# Patient Record
Sex: Female | Born: 1980 | Race: White | Hispanic: Yes | Marital: Single | Smoking: Never smoker
Health system: Southern US, Community
[De-identification: ages and names within clinical notes are randomized; demographics above are authoritative.]

## PROBLEM LIST (undated history)

## (undated) ENCOUNTER — Inpatient Hospital Stay (HOSPITAL_COMMUNITY): Payer: Self-pay

## (undated) DIAGNOSIS — L309 Dermatitis, unspecified: Secondary | ICD-10-CM

## (undated) DIAGNOSIS — B999 Unspecified infectious disease: Secondary | ICD-10-CM

## (undated) HISTORY — DX: Unspecified infectious disease: B99.9

## (undated) HISTORY — PX: TUBAL LIGATION: SHX77

---

## 2001-06-13 ENCOUNTER — Emergency Department (HOSPITAL_COMMUNITY): Admission: EM | Admit: 2001-06-13 | Discharge: 2001-06-13 | Payer: Self-pay | Admitting: Emergency Medicine

## 2004-11-13 HISTORY — PX: INCISION AND DRAINAGE POSTERIOR SACRAL SPINE: SHX1806

## 2005-05-05 ENCOUNTER — Ambulatory Visit: Payer: Self-pay | Admitting: *Deleted

## 2005-05-05 ENCOUNTER — Inpatient Hospital Stay (HOSPITAL_COMMUNITY): Admission: AD | Admit: 2005-05-05 | Discharge: 2005-05-05 | Payer: Self-pay | Admitting: *Deleted

## 2005-05-15 ENCOUNTER — Ambulatory Visit: Payer: Self-pay | Admitting: Family Medicine

## 2005-05-15 ENCOUNTER — Inpatient Hospital Stay (HOSPITAL_COMMUNITY): Admission: AD | Admit: 2005-05-15 | Discharge: 2005-05-15 | Payer: Self-pay | Admitting: Obstetrics & Gynecology

## 2005-10-05 ENCOUNTER — Inpatient Hospital Stay (HOSPITAL_COMMUNITY): Admission: AD | Admit: 2005-10-05 | Discharge: 2005-10-07 | Payer: Self-pay | Admitting: Obstetrics

## 2007-06-11 ENCOUNTER — Emergency Department (HOSPITAL_COMMUNITY): Admission: EM | Admit: 2007-06-11 | Discharge: 2007-06-11 | Payer: Self-pay | Admitting: Emergency Medicine

## 2009-07-15 ENCOUNTER — Inpatient Hospital Stay (HOSPITAL_COMMUNITY): Admission: EM | Admit: 2009-07-15 | Discharge: 2009-07-20 | Payer: Self-pay | Admitting: Emergency Medicine

## 2009-08-13 ENCOUNTER — Ambulatory Visit: Payer: Self-pay | Admitting: Internal Medicine

## 2009-09-24 ENCOUNTER — Ambulatory Visit: Payer: Self-pay | Admitting: Family Medicine

## 2009-09-24 LAB — CONVERTED CEMR LAB
BUN: 13 mg/dL (ref 6–23)
Calcium: 10.2 mg/dL (ref 8.4–10.5)
Creatinine, Ser: 0.55 mg/dL (ref 0.40–1.20)
Free T4: 1.17 ng/dL (ref 0.80–1.80)
Glucose, Bld: 85 mg/dL (ref 70–99)
HCT: 40.4 % (ref 36.0–46.0)
Hemoglobin: 13.6 g/dL (ref 12.0–15.0)
Lymphocytes Relative: 33 % (ref 12–46)
Monocytes Relative: 11 % (ref 3–12)
Neutrophils Relative %: 54 % (ref 43–77)
Potassium: 4.2 meq/L (ref 3.5–5.3)
RDW: 13.1 % (ref 11.5–15.5)
Sodium: 138 meq/L (ref 135–145)
TSH: 0.489 microintl units/mL (ref 0.350–4.500)
Vit D, 25-Hydroxy: 26 ng/mL — ABNORMAL LOW (ref 30–89)

## 2010-01-14 ENCOUNTER — Ambulatory Visit: Payer: Self-pay | Admitting: Family Medicine

## 2010-02-24 ENCOUNTER — Ambulatory Visit: Payer: Self-pay | Admitting: Family Medicine

## 2010-07-05 ENCOUNTER — Emergency Department (HOSPITAL_COMMUNITY): Admission: EM | Admit: 2010-07-05 | Discharge: 2010-07-05 | Payer: Self-pay | Admitting: Emergency Medicine

## 2011-01-27 LAB — POCT I-STAT, CHEM 8
Chloride: 107 mEq/L (ref 96–112)
Creatinine, Ser: 0.7 mg/dL (ref 0.4–1.2)
Glucose, Bld: 107 mg/dL — ABNORMAL HIGH (ref 70–99)
HCT: 36 % (ref 36.0–46.0)
Hemoglobin: 12.2 g/dL (ref 12.0–15.0)
Potassium: 3.5 mEq/L (ref 3.5–5.1)
TCO2: 22 mmol/L (ref 0–100)

## 2011-01-27 LAB — URINALYSIS, ROUTINE W REFLEX MICROSCOPIC
Bilirubin Urine: NEGATIVE
Glucose, UA: NEGATIVE mg/dL
pH: 5.5 (ref 5.0–8.0)

## 2011-01-27 LAB — URINE CULTURE
Colony Count: >=100000
Culture  Setup Time: 201108231554

## 2011-01-27 LAB — DIFFERENTIAL
Basophils Absolute: 0 10*3/uL (ref 0.0–0.1)
Basophils Relative: 0 % (ref 0–1)
Eosinophils Relative: 0 % (ref 0–5)
Lymphocytes Relative: 6 % — ABNORMAL LOW (ref 12–46)
Neutro Abs: 15.3 10*3/uL — ABNORMAL HIGH (ref 1.7–7.7)
Neutrophils Relative %: 88 % — ABNORMAL HIGH (ref 43–77)

## 2011-01-27 LAB — CBC
HCT: 34.6 % — ABNORMAL LOW (ref 36.0–46.0)
MCHC: 35 g/dL (ref 30.0–36.0)

## 2011-01-27 LAB — URINE MICROSCOPIC-ADD ON

## 2011-02-17 LAB — CULTURE, BLOOD (ROUTINE X 2): Culture: NO GROWTH

## 2011-02-17 LAB — BASIC METABOLIC PANEL
BUN: 3 mg/dL — ABNORMAL LOW (ref 6–23)
BUN: 6 mg/dL (ref 6–23)
BUN: 7 mg/dL (ref 6–23)
Calcium: 7.6 mg/dL — ABNORMAL LOW (ref 8.4–10.5)
Calcium: 8.1 mg/dL — ABNORMAL LOW (ref 8.4–10.5)
Chloride: 108 mEq/L (ref 96–112)
Chloride: 114 mEq/L — ABNORMAL HIGH (ref 96–112)
Creatinine, Ser: 0.47 mg/dL (ref 0.4–1.2)
Creatinine, Ser: 0.51 mg/dL (ref 0.4–1.2)
GFR calc non Af Amer: >60 mL/min (ref >60)
GFR calc non Af Amer: >60 mL/min (ref >60)
Glucose, Bld: 108 mg/dL — ABNORMAL HIGH (ref 70–99)
Glucose, Bld: 170 mg/dL — ABNORMAL HIGH (ref 70–99)
Potassium: 3.5 mEq/L (ref 3.5–5.1)
Potassium: 3.7 mEq/L (ref 3.5–5.1)
Sodium: 141 mEq/L (ref 135–145)

## 2011-02-17 LAB — DIFFERENTIAL
Basophils Absolute: 0 10*3/uL (ref 0.0–0.1)
Basophils Absolute: 0 10*3/uL (ref 0.0–0.1)
Basophils Absolute: 0 10*3/uL (ref 0.0–0.1)
Basophils Absolute: 0 10*3/uL (ref 0.0–0.1)
Basophils Absolute: 0 10*3/uL (ref 0.0–0.1)
Basophils Relative: 0 % (ref 0–1)
Eosinophils Absolute: 0 10*3/uL (ref 0.0–0.7)
Eosinophils Absolute: 0 10*3/uL (ref 0.0–0.7)
Eosinophils Relative: 0 % (ref 0–5)
Eosinophils Relative: 0 % (ref 0–5)
Eosinophils Relative: 0 % (ref 0–5)
Lymphocytes Relative: 16 % (ref 12–46)
Lymphocytes Relative: 28 % (ref 12–46)
Lymphocytes Relative: 9 % — ABNORMAL LOW (ref 12–46)
Lymphs Abs: 0.9 10*3/uL (ref 0.7–4.0)
Lymphs Abs: 1.5 10*3/uL (ref 0.7–4.0)
Lymphs Abs: 2.6 10*3/uL (ref 0.7–4.0)
Monocytes Absolute: 0.6 10*3/uL (ref 0.1–1.0)
Monocytes Absolute: 0.8 10*3/uL (ref 0.1–1.0)
Neutro Abs: 5.8 10*3/uL (ref 1.7–7.7)
Neutro Abs: 6 10*3/uL (ref 1.7–7.7)
Neutro Abs: 7.5 10*3/uL (ref 1.7–7.7)
Neutrophils Relative %: 64 % (ref 43–77)
Neutrophils Relative %: 82 % — ABNORMAL HIGH (ref 43–77)
Neutrophils Relative %: 89 % — ABNORMAL HIGH (ref 43–77)

## 2011-02-17 LAB — MAGNESIUM
Magnesium: 1.6 mg/dL (ref 1.5–2.5)
Magnesium: 2.1 mg/dL (ref 1.5–2.5)
Magnesium: 2.4 mg/dL (ref 1.5–2.5)

## 2011-02-17 LAB — BRAIN NATRIURETIC PEPTIDE
Pro B Natriuretic peptide (BNP): 160 pg/mL — ABNORMAL HIGH (ref 0.0–100.0)
Pro B Natriuretic peptide (BNP): 214 pg/mL — ABNORMAL HIGH (ref 0.0–100.0)

## 2011-02-17 LAB — CBC
HCT: 31.2 % — ABNORMAL LOW (ref 36.0–46.0)
HCT: 31.5 % — ABNORMAL LOW (ref 36.0–46.0)
HCT: 38.4 % (ref 36.0–46.0)
Hemoglobin: 10.6 g/dL — ABNORMAL LOW (ref 12.0–15.0)
Hemoglobin: 10.8 g/dL — ABNORMAL LOW (ref 12.0–15.0)
Hemoglobin: 13.1 g/dL (ref 12.0–15.0)
MCHC: 33.8 g/dL (ref 30.0–36.0)
MCHC: 34 g/dL (ref 30.0–36.0)
MCV: 91.6 fL (ref 78.0–100.0)
MCV: 91.8 fL (ref 78.0–100.0)
MCV: 92.4 fL (ref 78.0–100.0)
MCV: 92.7 fL (ref 78.0–100.0)
Platelets: 182 10*3/uL (ref 150–400)
Platelets: 184 10*3/uL (ref 150–400)
Platelets: 214 10*3/uL (ref 150–400)
Platelets: 259 10*3/uL (ref 150–400)
RBC: 3.44 MIL/uL — ABNORMAL LOW (ref 3.87–5.11)
RBC: 4.16 MIL/uL (ref 3.87–5.11)
RDW: 13.4 % (ref 11.5–15.5)
RDW: 13.5 % (ref 11.5–15.5)
RDW: 13.6 % (ref 11.5–15.5)
RDW: 13.8 % (ref 11.5–15.5)
WBC: 11.8 10*3/uL — ABNORMAL HIGH (ref 4.0–10.5)
WBC: 13.5 10*3/uL — ABNORMAL HIGH (ref 4.0–10.5)
WBC: 7.8 10*3/uL (ref 4.0–10.5)
WBC: 9.4 10*3/uL (ref 4.0–10.5)

## 2011-02-17 LAB — COMPREHENSIVE METABOLIC PANEL
AST: 21 U/L (ref 0–37)
AST: 23 U/L (ref 0–37)
BUN: 2 mg/dL — ABNORMAL LOW (ref 6–23)
BUN: 8 mg/dL (ref 6–23)
CO2: 21 mEq/L (ref 19–32)
Calcium: 9 mg/dL (ref 8.4–10.5)
Chloride: 109 mEq/L (ref 96–112)
Creatinine, Ser: 0.56 mg/dL (ref 0.4–1.2)
GFR calc Af Amer: >60 mL/min (ref >60)
GFR calc non Af Amer: >60 mL/min (ref >60)
Glucose, Bld: 193 mg/dL — ABNORMAL HIGH (ref 70–99)
Potassium: 3.5 mEq/L (ref 3.5–5.1)
Sodium: 138 mEq/L (ref 135–145)
Total Bilirubin: 0.4 mg/dL (ref 0.3–1.2)
Total Bilirubin: 1.1 mg/dL (ref 0.3–1.2)

## 2011-02-17 LAB — URINE MICROSCOPIC-ADD ON

## 2011-02-17 LAB — URINALYSIS, ROUTINE W REFLEX MICROSCOPIC
Glucose, UA: NEGATIVE mg/dL
Nitrite: POSITIVE — AB
Protein, ur: 100 mg/dL — AB
Urobilinogen, UA: 0.2 mg/dL (ref 0.0–1.0)

## 2011-02-17 LAB — CARDIAC PANEL(CRET KIN+CKTOT+MB+TROPI)
CK, MB: 1.2 ng/mL (ref 0.3–4.0)
Total CK: 73 U/L (ref 7–177)
Troponin I: 0.01 ng/mL (ref 0.00–0.06)

## 2011-02-17 LAB — CALCIUM: Calcium: 7 mg/dL — ABNORMAL LOW (ref 8.4–10.5)

## 2011-02-17 LAB — T4, FREE: Free T4: 0.93 ng/dL (ref 0.80–1.80)

## 2011-02-17 LAB — URINE CULTURE

## 2011-03-28 NOTE — H&P (Signed)
NAMEGRACYNN, RAJEWSKI               ACCOUNT NO.:  [id number]   MEDICAL RECORD NO.:  [id number]          PATIENT TYPE:  OBV   LOCATION:  1864                         FACILITY:  [facility]   PHYSICIAN:  Lonia Blood, M.D.      DATE OF BIRTH:  November 07, 1981   DATE OF ADMISSION:  07/15/2009  DATE OF DISCHARGE:                              HISTORY & PHYSICAL   PRIMARY CARE PHYSICIAN:  The patient is unassigned to [place].   PRESENTING COMPLAINT:  Nausea, vomiting, back pain and fever.   HISTORY OF PRESENT ILLNESS:  The patient is a 30 year old Hispanic  female with past history of urinary tract infection who had been doing  relatively well until 2 days ago when she started having some bilateral  flank pain.  This was associated with some fever and chills.  The  patient then started having nausea and vomiting, and this has persisted.  She came to the emergency room where she was seen and treated.  Her pain  was then rated as 6-7/10.  After some dose of Dilaudid, it went back to  0/10.  But she has continued to be weak and also continued to vomit.  She has had fever and chills at home.  She took only ibuprofen at home  prior to coming in.  She is still unable to keep food down.  An attempt  to discharge the patient was done, however, she could not go home since  she was unable to keep anything down.   PAST MEDICAL HISTORY:  Mainly previous urinary tract infection.   ALLERGIES:  NO KNOWN DRUG ALLERGIES.   MEDICATION:  Occasional ibuprofen.   SOCIAL HISTORY:  She lives in [place].  Denied any tobacco, alcohol  or IV drug use.  She is gainfully employed.   FAMILY HISTORY:  Denied any significant family history.   REVIEW OF SYSTEMS:  A 14 point review of systems is negative except per  HPI.   PHYSICAL EXAMINATION:  VITAL SIGNS:  Temperature 101.7, blood pressure  initially 103/63, currently 91/58 with a pulse of 120, respiratory rate  16.  Saturations 96% on room air.  GENERAL:  She is awake,  alert and acutely ill-looking, but in no acute  distress.  HEENT:  PERRLA.  EOMI.  NECK:  Supple.  No JVD, no lymphadenopathy.  RESPIRATORY:  She has good air entry bilaterally.  No wheezes, no rales.  CARDIOVASCULAR:  She is tachycardiac.  ABDOMEN:  Soft, mild tenderness, including right-sided costovertebral  angle tenderness.  Positive bowel sounds.  EXTREMITIES:  Showed no edema, cyanosis or clubbing.   LABORATORY DATA:  White count is 13.5, hemoglobin 13.1, platelet count  237 with left shift.  ANC of 12.1.  Sodium 138, potassium 3.5, chloride  105, CO2 of 23, glucose 100, BUN 8, creatinine 0.74, calcium 9.0 and the  rest of the LFTs are within normal.  Urinalysis showed yellow cloudy  urine with moderate hemoglobin, ketones more than 80, protein over 100,  positive nitrite, large leukocyte esterase, urine epithelials few, WBCs  too numerous to count, RBCs 3-6 and many bacteria.  ASSESSMENT:  This is a 30 year old female presenting with evidence of  sepsis with impending shock.  She is hypotensive despite initial fluids.  The patient also has evidence of acute pyelonephritis.   PLAN:  1. Acute pyelonephritis.  We will admit the patient and start IV      antibiotics.  Due to the presence of some Cipro resistant organisms      in the community, I will put her on Rocephin instead and manage her      impending sepsis in the step-down unit for now at least.  2. Sepsis.  Due to hypotension with possible septic shock being      considered, I will put her in the step-down unit.  I will give her      a bolus of fluids, saline mainly and then put keep her on like 200      mL of D5 normal.  If her blood pressure does not come up      appreciably, I will consider some dopamine.  3. Leukocytosis and dehydration.  These are all related to her sepsis      above.  Further treatment will depend on her initial response to      these measures.      Lonia Blood, M.D.  Electronically  Signed     LG/MEDQ  D:  07/15/2009  T:  07/15/2009  Job:  [id number]

## 2015-12-30 ENCOUNTER — Encounter (HOSPITAL_COMMUNITY): Payer: Self-pay | Admitting: *Deleted

## 2015-12-30 ENCOUNTER — Inpatient Hospital Stay (HOSPITAL_COMMUNITY)
Admission: AD | Admit: 2015-12-30 | Discharge: 2015-12-30 | Disposition: A | Discharge status: Home / Self Care | Payer: Self-pay | Source: Ambulatory Visit | Attending: Obstetrics & Gynecology | Admitting: Obstetrics & Gynecology

## 2015-12-30 ENCOUNTER — Inpatient Hospital Stay (HOSPITAL_COMMUNITY): Payer: Self-pay

## 2015-12-30 DIAGNOSIS — O039 Complete or unspecified spontaneous abortion without complication: Secondary | ICD-10-CM | POA: Insufficient documentation

## 2015-12-30 DIAGNOSIS — Z3201 Encounter for pregnancy test, result positive: Secondary | ICD-10-CM | POA: Insufficient documentation

## 2015-12-30 DIAGNOSIS — O021 Missed abortion: Secondary | ICD-10-CM

## 2015-12-30 DIAGNOSIS — O209 Hemorrhage in early pregnancy, unspecified: Secondary | ICD-10-CM | POA: Insufficient documentation

## 2015-12-30 DIAGNOSIS — Z3A08 8 weeks gestation of pregnancy: Secondary | ICD-10-CM | POA: Insufficient documentation

## 2015-12-30 LAB — URINE MICROSCOPIC-ADD ON

## 2015-12-30 LAB — POCT PREGNANCY, URINE: Preg Test, Ur: POSITIVE — AB

## 2015-12-30 LAB — WET PREP, GENITAL
Clue Cells Wet Prep HPF POC: NONE SEEN
SPERM: NONE SEEN
TRICH WET PREP: NONE SEEN
YEAST WET PREP: NONE SEEN

## 2015-12-30 LAB — CBC
HCT: 36.3 % (ref 36.0–46.0)
Hemoglobin: 12.5 g/dL (ref 12.0–15.0)
MCH: 30.2 pg (ref 26.0–34.0)
MCHC: 34.4 g/dL (ref 30.0–36.0)
MCV: 87.7 fL (ref 78.0–100.0)
PLATELETS: 315 10*3/uL (ref 150–400)
RBC: 4.14 MIL/uL (ref 3.87–5.11)
RDW: 13.1 % (ref 11.5–15.5)
WBC: 6.5 10*3/uL (ref 4.0–10.5)

## 2015-12-30 LAB — URINALYSIS, ROUTINE W REFLEX MICROSCOPIC
BILIRUBIN URINE: NEGATIVE
GLUCOSE, UA: NEGATIVE mg/dL
Ketones, ur: 15 mg/dL — AB
NITRITE: NEGATIVE
PH: 7 (ref 5.0–8.0)
Protein, ur: NEGATIVE mg/dL
SPECIFIC GRAVITY, URINE: 1.015 (ref 1.005–1.030)

## 2015-12-30 LAB — HCG, QUANTITATIVE, PREGNANCY: HCG, BETA CHAIN, QUANT, S: 62879 m[IU]/mL — AB (ref <5)

## 2015-12-30 LAB — ABO/RH: ABO/RH(D): O POS

## 2015-12-30 NOTE — Discharge Instructions (Signed)
Miscarriage  A miscarriage is the sudden loss of an unborn baby (fetus) before the 20th week of pregnancy. Most miscarriages happen in the first 3 months of pregnancy. Sometimes, it happens before a woman even knows she is pregnant. A miscarriage is also called a "spontaneous miscarriage" or "early pregnancy loss." Having a miscarriage can be an emotional experience. Talk with your caregiver about any questions you may have about miscarrying, the grieving process, and your future pregnancy plans.  CAUSES    Problems with the fetal chromosomes that make it impossible for the baby to develop normally. Problems with the baby's genes or chromosomes are most often the result of errors that occur, by chance, as the embryo divides and grows. The problems are not inherited from the parents.   Infection of the cervix or uterus.    Hormone problems.    Problems with the cervix, such as having an incompetent cervix. This is when the tissue in the cervix is not strong enough to hold the pregnancy.    Problems with the uterus, such as an abnormally shaped uterus, uterine fibroids, or congenital abnormalities.    Certain medical conditions.    Smoking, drinking alcohol, or taking illegal drugs.    Trauma.   Often, the cause of a miscarriage is unknown.   SYMPTOMS    Vaginal bleeding or spotting, with or without cramps or pain.   Pain or cramping in the abdomen or lower back.   Passing fluid, tissue, or blood clots from the vagina.  DIAGNOSIS   Your caregiver will perform a physical exam. You may also have an ultrasound to confirm the miscarriage. Blood or urine tests may also be ordered.  TREATMENT    Sometimes, treatment is not necessary if you naturally pass all the fetal tissue that was in the uterus. If some of the fetus or placenta remains in the body (incomplete miscarriage), tissue left behind may become infected and must be removed. Usually, a dilation and curettage (D and C) procedure is performed.  During a D and C procedure, the cervix is widened (dilated) and any remaining fetal or placental tissue is gently removed from the uterus.   Antibiotic medicines are prescribed if there is an infection. Other medicines may be given to reduce the size of the uterus (contract) if there is a lot of bleeding.   If you have Rh negative blood and your baby was Rh positive, you will need a Rh immunoglobulin shot. This shot will protect any future baby from having Rh blood problems in future pregnancies.  HOME CARE INSTRUCTIONS    Your caregiver may order bed rest or may allow you to continue light activity. Resume activity as directed by your caregiver.   Have someone help with home and family responsibilities during this time.    Keep track of the number of sanitary pads you use each day and how soaked (saturated) they are. Write down this information.    Do not use tampons. Do not douche or have sexual intercourse until approved by your caregiver.    Only take over-the-counter or prescription medicines for pain or discomfort as directed by your caregiver.    Do not take aspirin. Aspirin can cause bleeding.    Keep all follow-up appointments with your caregiver.    If you or your partner have problems with grieving, talk to your caregiver or seek counseling to help cope with the pregnancy loss. Allow enough time to grieve before trying to get pregnant again.     SEEK IMMEDIATE MEDICAL CARE IF:    You have severe cramps or pain in your back or abdomen.   You have a fever.   You pass large blood clots (walnut-sized or larger) ortissue from your vagina. Save any tissue for your caregiver to inspect.    Your bleeding increases.    You have a thick, bad-smelling vaginal discharge.   You become lightheaded, weak, or you faint.    You have chills.   MAKE SURE YOU:   Understand these instructions.   Will watch your condition.   Will get help right away if you are not doing well or get worse.     This  information is not intended to replace advice given to you by your health care provider. Make sure you discuss any questions you have with your health care provider.     Document Released: 04/25/2001 Document Revised: 02/24/2013 Document Reviewed: 12/19/2011  Elsevier Interactive Patient Education 2016 [employer]

## 2015-12-30 NOTE — MAU Note (Signed)
Pt presents to MAU with complaints of vaginal bleeding when she wipes that started this evening. Pt denies any pain. LMP 10/30/15

## 2015-12-30 NOTE — MAU Provider Note (Signed)
History     CSN: [id number]  Arrival date and time: 12/30/15 1618   First Provider Initiated Contact with Patient 12/30/15 1735      Chief Complaint  Patient presents with  . Vaginal Bleeding   HPI   [name] is a 35 y.o. female G2P0001 at [redacted]w[redacted]d presenting to MAU with new onset vaginal bleeding. She says she went to the bathroom at 1530 and noticed blood on the toilet tissue. She denies constipation. She is the first time she has had vaginal bleeding. The bleeding is described as brown in color. No recent intercourse.   She denies pain.   OB History    Gravida Para Term Preterm AB TAB SAB Ectopic Multiple Living   History reviewed. No pertinent past medical history.  Past Surgical History  Procedure Laterality Date  . No past surgeries      Family History  Problem Relation Age of Onset  . Hypertension Father     Social History  Substance Use Topics  . Smoking status: Never Smoker   . Smokeless tobacco: Never Used  . Alcohol Use: No    Allergies: No Known Allergies  Prescriptions prior to admission  Medication Sig Dispense Refill Last Dose  . Multiple Vitamin (MULTIVITAMIN WITH MINERALS) TABS tablet Take 1 tablet by mouth daily.   12/29/2015 at Unknown time   Results for orders placed or performed during the hospital encounter of 12/30/15 (from the past 48 hour(s))  Urinalysis, Routine w reflex microscopic (not at [facility])     Status: Abnormal   Collection Time: 12/30/15  4:25 PM  Result Value Ref Range   Color, Urine YELLOW YELLOW   APPearance HAZY (A) CLEAR   Specific Gravity, Urine 1.015 1.005 - 1.030   pH 7.0 5.0 - 8.0   Glucose, UA NEGATIVE NEGATIVE mg/dL   Hgb urine dipstick MODERATE (A) NEGATIVE   Bilirubin Urine NEGATIVE NEGATIVE   Ketones, ur 15 (A) NEGATIVE mg/dL   Protein, ur NEGATIVE NEGATIVE mg/dL   Nitrite NEGATIVE NEGATIVE   Leukocytes, UA MODERATE (A) NEGATIVE  Urine microscopic-add on     Status: Abnormal    Collection Time: 12/30/15  4:25 PM  Result Value Ref Range   Squamous Epithelial / LPF 0-5 (A) NONE SEEN   WBC, UA 0-5 0 - 5 WBC/hpf   RBC / HPF 0-5 0 - 5 RBC/hpf   Bacteria, UA FEW (A) NONE SEEN   Urine-Other AMORPHOUS URATES/PHOSPHATES   Pregnancy, urine POC     Status: Abnormal   Collection Time: 12/30/15  4:35 PM  Result Value Ref Range   Preg Test, Ur POSITIVE (A) NEGATIVE    Comment:        THE SENSITIVITY OF THIS METHODOLOGY IS >24 mIU/mL   CBC     Status: None   Collection Time: 12/30/15  6:07 PM  Result Value Ref Range   WBC 6.5 4.0 - 10.5 K/uL   RBC 4.14 3.87 - 5.11 MIL/uL   Hemoglobin 12.5 12.0 - 15.0 g/dL   HCT 95.2 84.1 - 32.4 %   MCV 87.7 78.0 - 100.0 fL   MCH 30.2 26.0 - 34.0 pg   MCHC 34.4 30.0 - 36.0 g/dL   RDW 40.1 02.7 - 25.3 %   Platelets 315 150 - 400 K/uL  ABO/Rh     Status: None (Preliminary result)   Collection Time: 12/30/15  6:07 PM  Result Value Ref Range   ABO/RH(D) O POS   hCG, quantitative, pregnancy     Status: Abnormal   Collection Time: 12/30/15  6:07 PM  Result Value Ref Range   hCG, Beta Chain, Quant, S 62879 (H) <5 mIU/mL    Comment:          GEST. AGE      CONC.  (mIU/mL)   <=1 WEEK        5 - 50     2 WEEKS       50 - 500     3 WEEKS       100 - 10,000     4 WEEKS     1,000 - 30,000     5 WEEKS     3,500 - 115,000   6-8 WEEKS     12,000 - 270,000    12 WEEKS     15,000 - 220,000        FEMALE AND NON-PREGNANT FEMALE:     LESS THAN 5 mIU/mL   Wet prep, genital     Status: Abnormal   Collection Time: 12/30/15  6:15 PM  Result Value Ref Range   Yeast Wet Prep HPF POC NONE SEEN NONE SEEN   Trich, Wet Prep NONE SEEN NONE SEEN   Clue Cells Wet Prep HPF POC NONE SEEN NONE SEEN   WBC, Wet Prep HPF POC MANY (A) NONE SEEN    Comment: MANY BACTERIA SEEN   Sperm NONE SEEN     US Ob Comp Less 14 Wks  12/30/2015  CLINICAL DATA:  First trimester of pregnancy, vaginal bleeding. EXAM: OBSTETRIC <14 WK [place] AND TRANSVAGINAL OB US  TECHNIQUE: Both transabdominal and transvaginal ultrasound examinations were performed for complete evaluation of the gestation as well as the maternal uterus, adnexal regions, and pelvic cul-de-sac. Transvaginal technique was performed to assess early pregnancy. COMPARISON:  None. FINDINGS: Intrauterine gestational sac: Visualized. Yolk sac:  Visualized. Embryo:  Visualized. Cardiac Activity: Not visualized. CRL: 21.6 mm 8 w 6 d [place] [facility]: August 04, 2016. Subchorionic hemorrhage:  Small subchronic hemorrhage is noted. Maternal uterus/adnexae: Probable corpus luteum cyst seen in right ovary. Left ovary appears normal. Trace free fluid is noted most likely physiologic. IMPRESSION: Findings meet definitive criteria for failed pregnancy. This follows SRU consensus guidelines: Diagnostic Criteria for Nonviable Pregnancy Early in the First Trimester. [facility] J Med [phone]. Electronically Signed   By: Lupita Raider, M.D.   On: 12/30/2015 18:56   US Ob Transvaginal  12/30/2015  CLINICAL DATA:  First trimester of pregnancy, vaginal bleeding. EXAM: OBSTETRIC <14 WK [place] AND TRANSVAGINAL OB US TECHNIQUE: Both transabdominal and transvaginal ultrasound examinations were performed for complete evaluation of the gestation as well as the maternal uterus, adnexal regions, and pelvic cul-de-sac. Transvaginal technique was performed to assess early pregnancy. COMPARISON:  None. FINDINGS: Intrauterine gestational sac: Visualized. Yolk sac:  Visualized. Embryo:  Visualized. Cardiac Activity: Not visualized. CRL: 21.6 mm 8 w 6 d [place] [facility]: August 04, 2016. Subchorionic hemorrhage:  Small subchronic hemorrhage is noted. Maternal uterus/adnexae: Probable corpus luteum cyst seen in right ovary. Left ovary appears normal. Trace free fluid is noted most likely physiologic. IMPRESSION: Findings meet definitive criteria for failed pregnancy. This follows SRU consensus guidelines: Diagnostic Criteria for Nonviable Pregnancy Early  in the First Trimester. [facility] J [facility] [phone]. Electronically Signed   By: Lupita Raider, M.D.   On: 12/30/2015 18:56    Review of Systems  Constitutional: Negative for fever.  Gastrointestinal: Positive for nausea. Negative for vomiting and abdominal pain.  Genitourinary: Negative for dysuria.  Musculoskeletal: Negative for back pain.   Physical Exam   Blood pressure 123/76, pulse 98, temperature 98.3 F (36.8 C), resp. rate 18, last menstrual period 10/30/2015.  Physical Exam  Constitutional: She is oriented to person, place, and time. She appears well-developed and well-nourished.  HENT:  Head: Normocephalic.  Eyes: Pupils are equal, round, and reactive to light.  Neck: Neck supple.  GI: Soft. She exhibits no distension. There is no tenderness. There is no rebound.  Genitourinary:  Speculum exam: Vagina - Small amount of creamy, brown discharge, no odor Cervix - No contact bleeding, no active bleeding  Bimanual exam: Cervix closed Uterus non tender, enlarged  Adnexa non tender, no masses bilaterally GC/Chlam, wet prep done Chaperone present for exam.  Musculoskeletal: Normal range of motion.  Neurological: She is alert and oriented to person, place, and time.  Skin: Skin is warm.  Psychiatric: Her behavior is normal.    MAU Course  Procedures  None  MDM  Discussed fetal US with the patient and significant other. Discussed watchful waiting>cytotec> patient asked about surgery. Patient would like to have a D&C Message sent to [place] for scheduling.   O positive blood type   Assessment and Plan   A:  1. Abortion, missed   2. Vaginal bleeding in pregnancy, first trimester     P:  Discharge home in stable condition Bleeding precautions Return to MAU if symptoms worsen WOC will call to schedule D&C Support given  Duane Lope, NP 12/30/2015 7:45 PM

## 2015-12-31 ENCOUNTER — Ambulatory Visit (HOSPITAL_COMMUNITY): Payer: Self-pay | Admitting: Anesthesiology

## 2015-12-31 ENCOUNTER — Ambulatory Visit (HOSPITAL_COMMUNITY)
Admission: AD | Admit: 2015-12-31 | Discharge: 2015-12-31 | Disposition: A | Discharge status: Home / Self Care | Payer: Self-pay | Source: Ambulatory Visit | Attending: Obstetrics & Gynecology | Admitting: Obstetrics & Gynecology

## 2015-12-31 ENCOUNTER — Encounter (HOSPITAL_COMMUNITY)
Admission: AD | Disposition: A | Discharge status: Home / Self Care | Payer: Self-pay | Source: Ambulatory Visit | Attending: Obstetrics & Gynecology

## 2015-12-31 ENCOUNTER — Encounter (HOSPITAL_COMMUNITY): Payer: Self-pay | Admitting: Emergency Medicine

## 2015-12-31 DIAGNOSIS — O021 Missed abortion: Secondary | ICD-10-CM | POA: Diagnosis present

## 2015-12-31 HISTORY — PX: DILATION AND EVACUATION: SHX1459

## 2015-12-31 LAB — GC/CHLAMYDIA PROBE AMP (~~LOC~~) NOT AT ARMC
CHLAMYDIA, DNA PROBE: NEGATIVE
NEISSERIA GONORRHEA: NEGATIVE

## 2015-12-31 LAB — HIV ANTIBODY (ROUTINE TESTING W REFLEX): HIV Screen 4th Generation wRfx: NONREACTIVE

## 2015-12-31 SURGERY — DILATION AND EVACUATION, UTERUS
Anesthesia: Monitor Anesthesia Care | Site: Vagina

## 2015-12-31 MED ORDER — DOXYCYCLINE HYCLATE 100 MG IV SOLR
200.0000 mg | INTRAVENOUS | Status: AC
  Administered 2015-12-31: 200 mg via INTRAVENOUS
  Filled 2015-12-31: qty 200

## 2015-12-31 MED ORDER — OXYCODONE-ACETAMINOPHEN 5-325 MG PO TABS: 1.0000 | ORAL_TABLET | Freq: Four times a day (QID) | ORAL | Status: DC | PRN

## 2015-12-31 MED ORDER — PROPOFOL 10 MG/ML IV BOLUS
INTRAVENOUS | Status: AC
  Filled 2015-12-31: qty 20

## 2015-12-31 MED ORDER — ONDANSETRON HCL 4 MG/2ML IJ SOLN
INTRAMUSCULAR | Status: DC | PRN
  Administered 2015-12-31: 4 mg via INTRAVENOUS

## 2015-12-31 MED ORDER — LACTATED RINGERS IV SOLN: INTRAVENOUS | Status: DC

## 2015-12-31 MED ORDER — SCOPOLAMINE 1 MG/3DAYS TD PT72
1.0000 | MEDICATED_PATCH | Freq: Once | TRANSDERMAL | Status: DC
  Administered 2015-12-31: 1.5 mg via TRANSDERMAL

## 2015-12-31 MED ORDER — ONDANSETRON HCL 4 MG/2ML IJ SOLN
INTRAMUSCULAR | Status: AC
  Filled 2015-12-31: qty 2

## 2015-12-31 MED ORDER — KETOROLAC TROMETHAMINE 30 MG/ML IJ SOLN
INTRAMUSCULAR | Status: AC
  Filled 2015-12-31: qty 1

## 2015-12-31 MED ORDER — PROPOFOL 500 MG/50ML IV EMUL
INTRAVENOUS | Status: DC | PRN
  Administered 2015-12-31: 75 ug/kg/min via INTRAVENOUS

## 2015-12-31 MED ORDER — LACTATED RINGERS IV SOLN
INTRAVENOUS | Status: DC
  Administered 2015-12-31: 125 mL/h via INTRAVENOUS

## 2015-12-31 MED ORDER — PROPOFOL 500 MG/50ML IV EMUL
INTRAVENOUS | Status: DC | PRN
  Administered 2015-12-31: 60 mg via INTRAVENOUS

## 2015-12-31 MED ORDER — BUPIVACAINE HCL 0.5 % IJ SOLN
INTRAMUSCULAR | Status: DC | PRN
  Administered 2015-12-31: 30 mL

## 2015-12-31 MED ORDER — SCOPOLAMINE 1 MG/3DAYS TD PT72
MEDICATED_PATCH | TRANSDERMAL | Status: AC
  Administered 2015-12-31: 1.5 mg via TRANSDERMAL
  Filled 2015-12-31: qty 1

## 2015-12-31 MED ORDER — DEXAMETHASONE SODIUM PHOSPHATE 10 MG/ML IJ SOLN
INTRAMUSCULAR | Status: DC | PRN
  Administered 2015-12-31: 4 mg via INTRAVENOUS

## 2015-12-31 MED ORDER — LIDOCAINE HCL (CARDIAC) 20 MG/ML IV SOLN
INTRAVENOUS | Status: DC | PRN
  Administered 2015-12-31: 100 mg via INTRAVENOUS

## 2015-12-31 MED ORDER — MIDAZOLAM HCL 2 MG/2ML IJ SOLN
INTRAMUSCULAR | Status: AC
  Filled 2015-12-31: qty 2

## 2015-12-31 MED ORDER — DOCUSATE SODIUM 100 MG PO CAPS: 100.0000 mg | ORAL_CAPSULE | Freq: Two times a day (BID) | ORAL | Status: DC | PRN

## 2015-12-31 MED ORDER — KETOROLAC TROMETHAMINE 30 MG/ML IJ SOLN
INTRAMUSCULAR | Status: DC | PRN
  Administered 2015-12-31: 30 mg via INTRAVENOUS

## 2015-12-31 MED ORDER — LACTATED RINGERS IV SOLN
INTRAVENOUS | Status: DC
  Administered 2015-12-31: 11:00:00 via INTRAVENOUS

## 2015-12-31 MED ORDER — BUPIVACAINE HCL (PF) 0.5 % IJ SOLN
INTRAMUSCULAR | Status: AC
  Filled 2015-12-31: qty 30

## 2015-12-31 MED ORDER — FENTANYL CITRATE (PF) 100 MCG/2ML IJ SOLN
INTRAMUSCULAR | Status: DC | PRN
  Administered 2015-12-31: 100 ug via INTRAVENOUS

## 2015-12-31 MED ORDER — FENTANYL CITRATE (PF) 100 MCG/2ML IJ SOLN: 25.0000 ug | INTRAMUSCULAR | Status: DC | PRN

## 2015-12-31 MED ORDER — MIDAZOLAM HCL 2 MG/2ML IJ SOLN
INTRAMUSCULAR | Status: DC | PRN
  Administered 2015-12-31: 2 mg via INTRAVENOUS

## 2015-12-31 MED ORDER — METHYLERGONOVINE MALEATE 0.2 MG/ML IJ SOLN
INTRAMUSCULAR | Status: DC | PRN
  Administered 2015-12-31: 0.2 mg via INTRAMUSCULAR

## 2015-12-31 MED ORDER — IBUPROFEN 600 MG PO TABS: 600.0000 mg | ORAL_TABLET | Freq: Four times a day (QID) | ORAL | Status: DC | PRN

## 2015-12-31 MED ORDER — LIDOCAINE HCL (CARDIAC) 20 MG/ML IV SOLN
INTRAVENOUS | Status: AC
  Filled 2015-12-31: qty 5

## 2015-12-31 MED ORDER — FENTANYL CITRATE (PF) 250 MCG/5ML IJ SOLN
INTRAMUSCULAR | Status: AC
  Filled 2015-12-31: qty 5

## 2015-12-31 SURGICAL SUPPLY — 19 items
CATH ROBINSON RED A/P 16FR (CATHETERS) ×2 IMPLANT
CLOTH BEACON ORANGE TIMEOUT ST (SAFETY) ×2 IMPLANT
DECANTER SPIKE VIAL GLASS SM (MISCELLANEOUS) ×2 IMPLANT
GLOVE BIOGEL PI IND STRL 7.0 (GLOVE) ×3 IMPLANT
GLOVE BIOGEL PI INDICATOR 7.0 (GLOVE) ×3
GLOVE ECLIPSE 7.0 STRL STRAW (GLOVE) ×2 IMPLANT
GOWN STRL REUS W/TWL LRG LVL3 (GOWN DISPOSABLE) ×4 IMPLANT
KIT BERKELEY 1ST TRIMESTER 3/8 (MISCELLANEOUS) ×2 IMPLANT
NS IRRIG 1000ML POUR BTL (IV SOLUTION) ×2 IMPLANT
PACK VAGINAL MINOR WOMEN LF (CUSTOM PROCEDURE TRAY) ×2 IMPLANT
PAD OB MATERNITY 4.3X12.25 (PERSONAL CARE ITEMS) ×2 IMPLANT
PAD PREP 24X48 CUFFED NSTRL (MISCELLANEOUS) ×2 IMPLANT
SET BERKELEY SUCTION TUBING (SUCTIONS) ×2 IMPLANT
TOWEL OR 17X24 6PK STRL BLUE (TOWEL DISPOSABLE) ×4 IMPLANT
VACURETTE 10 RIGID CVD (CANNULA) IMPLANT
VACURETTE 6 ASPIR F TIP BERK (CANNULA) IMPLANT
VACURETTE 7MM CVD STRL WRAP (CANNULA) IMPLANT
VACURETTE 8 RIGID CVD (CANNULA) IMPLANT
VACURETTE 9 RIGID CVD (CANNULA) ×1 IMPLANT

## 2015-12-31 NOTE — Transfer of Care (Signed)
Immediate Anesthesia Transfer of Care Note  Patient: Dana Wade  Procedure(s) Performed: Procedure(s): DILATATION AND EVACUATION (N/A)  Patient Location: PACU  Anesthesia Type:MAC  Level of Consciousness: awake, alert  and oriented  Airway & Oxygen Therapy: Patient Spontanous Breathing  Post-op Assessment: Report given to RN and Post -op Vital signs reviewed and stable  Post vital signs: Reviewed and stable  Last Vitals:  Filed Vitals:   12/31/15 1032  BP: 114/77  Pulse: 64  Temp: 36.9 C  Resp: 18    Complications: No apparent anesthesia complications

## 2015-12-31 NOTE — Anesthesia Preprocedure Evaluation (Signed)
Anesthesia Evaluation  Patient identified by MRN, date of birth, ID band Patient awake    Reviewed: Allergy & Precautions, H&P , NPO status , Patient's Chart, lab work & pertinent test results  Airway Mallampati: II  TM Distance: >3 FB Neck ROM: full    Dental no notable dental hx. (+) Dental Advisory Given, Teeth Intact   Pulmonary neg pulmonary ROS,    Pulmonary exam normal breath sounds clear to auscultation       Cardiovascular Exercise Tolerance: Good negative cardio ROS Normal cardiovascular exam Rhythm:regular Rate:Normal     Neuro/Psych negative neurological ROS  negative psych ROS   GI/Hepatic negative GI ROS, Neg liver ROS,   Endo/Other  negative endocrine ROS  Renal/GU negative Renal ROS  negative genitourinary   Musculoskeletal   Abdominal   Peds  Hematology negative hematology ROS (+)   Anesthesia Other Findings   Reproductive/Obstetrics negative OB ROS                             Anesthesia Physical Anesthesia Plan  ASA: I  Anesthesia Plan: MAC   Post-op Pain Management:    Induction:   Airway Management Planned:   Additional Equipment:   Intra-op Plan:   Post-operative Plan:   Informed Consent: I have reviewed the patients History and Physical, chart, labs and discussed the procedure including the risks, benefits and alternatives for the proposed anesthesia with the patient or authorized representative who has indicated his/her understanding and acceptance.   Dental Advisory Given  Plan Discussed with: CRNA and Surgeon  Anesthesia Plan Comments:         Anesthesia Quick Evaluation  

## 2015-12-31 NOTE — Discharge Instructions (Signed)
Dilatación y curetaje o curetaje por aspiración, cuidados posteriores  (Dilation and Curettage or Vacuum Curettage, Care After)  Siga estas instrucciones durante las próximas semanas. Estas indicaciones le proporcionan información acerca de cómo deberá cuidarse después del procedimiento. El médico también podrá darle instrucciones más específicas. El tratamiento se ha planificado de acuerdo a las prácticas médicas actuales, pero a veces se producen problemas. Comuníquese con el médico si tiene algún problema o tiene dudas después del procedimiento.  QUÉ ESPERAR DESPUÉS DEL PROCEDIMIENTO  Después del procedimiento, es típico tener cólicos. Pueden durar de 2 días a 2 semanas después del procedimiento.  INSTRUCCIONES PARA EL CUIDADO EN EL HOGAR   · No conduzca durante 24 horas.  · Espere una semana antes de retomar las actividades intensas.  · Tómese la temperatura dos veces al día, durante 4 días y regístrela. Si tiene fiebre, informe estas temperaturas a su médico.  · Evite permanecer de pie durante largos períodos.  · Evite levantar, tironear o empujar objetos pesados. No levante ningún objeto que pese más de 10 libras (4,5 kg).  · Solo suba escaleras una o dos veces por día.  · Tome con frecuencia momentos de descanso.  · Puede retomar su dieta habitual.  · Beba suficiente líquido para mantener la orina clara o de color amarillo pálido.  · La función intestinal normal se restablecerá. Si está estreñida, podrá:    Tomar un laxante suave con autorización de su médico.    Agregar frutas y salvado a su dieta.    Beber más líquidos.  · Tome duchas en lugar de baños de inmersión hasta que el médico la autorice.  · No practique natación ni utilice el jacuzzi hasta que el médico la autorice.  · Pídale a alguna persona que permanezca con usted durante las primeras 24 a 48 horas, especialmente si le han administrado anestesia general.  · No se haga duchas vaginales, no use tampones ni tenga sexo (relaciones sexuales) durante  las 2 semanas siguientes al procedimiento.  · Tome solo medicamentos de venta libre o recetados, según las indicaciones del médico. No tome aspirina. Puede ocasionar hemorragias.  · Concurra a las consultas de control con su médico según las indicaciones.  SOLICITE ATENCIÓN MÉDICA SI:   · Siente cólicos o el dolor no se alivia con la medicación.  · Siente dolor abdominal que no parece estar relacionado con el área en que sentía los cólicos y el dolor anteriormente.  · Observa una secreción vaginal con mal olor.  · Tiene una erupción cutánea.  · Tiene problemas con algún medicamento.  SOLICITE ATENCIÓN MÉDICA DE INMEDIATO SI:   · Tiene una hemorragia más abundante que un período menstrual normal.  · Tiene fiebre.  · Siente dolor en el pecho.  · Le falta el aire.  · Se siente mareada o siente como si fuera a desmayarse.  · Se desmaya.  · Siente dolor en la zona de los breteles de los hombros.  · Tiene una hemorragia vaginal abundante, con o sin coágulos sanguíneos.  ASEGÚRESE DE QUE:   · Comprende estas instrucciones.  · Controlará su afección.  · Recibirá ayuda de inmediato si no mejora o si empeora.     Esta información no tiene como fin reemplazar el consejo del médico. Asegúrese de hacerle al médico cualquier pregunta que tenga.     Document Released: 08/09/2005 Document Revised: 11/04/2013  Elsevier Interactive Patient Education ©2016 Elsevier Inc.

## 2015-12-31 NOTE — H&P (Signed)
Preoperative History and Physical  Dana Wade is a 35 y.o. G2P0001 here for surgical management of missed abortion at [redacted] weeks GA.   No significant preoperative concerns.  Proposed surgery: Dilation and Evacuation (D&E)  Past Medical History History reviewed. No pertinent past medical history.   Past Surgical History  Procedure Laterality Date  . No past surgeries     OB History  Gravida Para Term Preterm AB SAB TAB Ectopic Multiple Living     # Outcome Date GA Lbr Len/2nd Weight Sex Delivery Anes PTL Lv  2 Current           1 Para 10/05/05    F Vag-Spont   Y    Patient denies any other pertinent gynecologic issues.   No current facility-administered medications on file prior to encounter.   Current Outpatient Prescriptions on File Prior to Encounter  Medication Sig Dispense Refill  . Multiple Vitamin (MULTIVITAMIN WITH MINERALS) TABS tablet Take 1 tablet by mouth daily.     No Known Allergies  Social History:   reports that she has never smoked. She has never used smokeless tobacco. She reports that she does not drink alcohol or use illicit drugs.  Family History  Problem Relation Age of Onset  . Hypertension Father     Review of Systems: Noncontributory  PHYSICAL EXAM: Blood pressure 114/77, pulse 64, temperature 98.5 F (36.9 C), temperature source Oral, resp. rate 18, height  (1.651 m), weight 141 lb (63.957 kg), last menstrual period 10/30/2015, SpO2 100 %. CONSTITUTIONAL: Well-developed, well-nourished female in no acute distress.  HENT:  Normocephalic, atraumatic, External right and left ear normal. Oropharynx is clear and moist EYES: Conjunctivae and EOM are normal. Pupils are equal, round, and reactive to light. No scleral icterus.  NECK: Normal range of motion, supple, no masses SKIN: Skin is warm and dry. No rash noted. Not diaphoretic. No erythema. No pallor. NEUROLOGIC: Alert and oriented to person, place, and time.  Normal reflexes, muscle tone coordination. No cranial nerve deficit noted. PSYCHIATRIC: Normal mood and affect. Normal behavior. Normal judgment and thought content. CARDIOVASCULAR: Normal heart rate noted, regular rhythm RESPIRATORY: Effort and breath sounds normal, no problems with respiration noted ABDOMEN: Soft, nontender, nondistended. PELVIC: Deferred MUSCULOSKELETAL: Normal range of motion. No edema and no tenderness. 2+ distal pulses.  Labs: Results for orders placed or performed during the hospital encounter of 12/30/15 (from the past 336 hour(s))  Urinalysis, Routine w reflex microscopic (not at [facility])   Collection Time: 12/30/15  4:25 PM  Result Value Ref Range   Color, Urine YELLOW YELLOW   APPearance HAZY (A) CLEAR   Specific Gravity, Urine 1.015 1.005 - 1.030   pH 7.0 5.0 - 8.0   Glucose, UA NEGATIVE NEGATIVE mg/dL   Hgb urine dipstick MODERATE (A) NEGATIVE   Bilirubin Urine NEGATIVE NEGATIVE   Ketones, ur 15 (A) NEGATIVE mg/dL   Protein, ur NEGATIVE NEGATIVE mg/dL   Nitrite NEGATIVE NEGATIVE   Leukocytes, UA MODERATE (A) NEGATIVE  Urine microscopic-add on   Collection Time: 12/30/15  4:25 PM  Result Value Ref Range   Squamous Epithelial / LPF 0-5 (A) NONE SEEN   WBC, UA 0-5 0 - 5 WBC/hpf   RBC / HPF 0-5 0 - 5 RBC/hpf   Bacteria, UA FEW (A) NONE SEEN   Urine-Other AMORPHOUS URATES/PHOSPHATES   Pregnancy, urine POC   Collection Time: 12/30/15  4:35 PM  Result Value Ref  Range   Preg Test, Ur POSITIVE (A) NEGATIVE  CBC   Collection Time: 15-Jan-2016  6:07 PM  Result Value Ref Range   WBC 6.5 4.0 - 10.5 K/uL   RBC 4.14 3.87 - 5.11 MIL/uL   Hemoglobin 12.5 12.0 - 15.0 g/dL   HCT 27.2 53.6 - 64.4 %   MCV 87.7 78.0 - 100.0 fL   MCH 30.2 26.0 - 34.0 pg   MCHC 34.4 30.0 - 36.0 g/dL   RDW 03.4 74.2 - 59.5 %   Platelets 315 150 - 400 K/uL  HIV antibody   Collection Time: 01/15/2016  6:07 PM  Result Value Ref Range   HIV Screen 4th Generation wRfx Non Reactive Non  Reactive  hCG, quantitative, pregnancy   Collection Time: Jan 15, 2016  6:07 PM  Result Value Ref Range   hCG, Beta Chain, Quant, S 63875 (H) <5 mIU/mL  ABO/Rh   Collection Time: January 15, 2016  6:07 PM  Result Value Ref Range   ABO/RH(D) O POS   Wet prep, genital   Collection Time: Jan 15, 2016  6:15 PM  Result Value Ref Range   Yeast Wet Prep HPF POC NONE SEEN NONE SEEN   Trich, Wet Prep NONE SEEN NONE SEEN   Clue Cells Wet Prep HPF POC NONE SEEN NONE SEEN   WBC, Wet Prep HPF POC MANY (A) NONE SEEN   Sperm NONE SEEN     Imaging Studies: US Ob Comp Less 14 Wks  2016/01/15  CLINICAL DATA:  First trimester of pregnancy, vaginal bleeding. EXAM: OBSTETRIC <14 WK [place] AND TRANSVAGINAL OB US TECHNIQUE: Both transabdominal and transvaginal ultrasound examinations were performed for complete evaluation of the gestation as well as the maternal uterus, adnexal regions, and pelvic cul-de-sac. Transvaginal technique was performed to assess early pregnancy. COMPARISON:  None. FINDINGS: Intrauterine gestational sac: Visualized. Yolk sac:  Visualized. Embryo:  Visualized. Cardiac Activity: Not visualized. CRL: 21.6 mm 8 w 6 d [place] [facility]: August 04, 2016. Subchorionic hemorrhage:  Small subchronic hemorrhage is noted. Maternal uterus/adnexae: Probable corpus luteum cyst seen in right ovary. Left ovary appears normal. Trace free fluid is noted most likely physiologic. IMPRESSION: Findings meet definitive criteria for failed pregnancy. This follows SRU consensus guidelines: Diagnostic Criteria for Nonviable Pregnancy Early in the First Trimester. [facility] Mis J Med [phone]. Electronically Signed   By: Lupita Raider, M.D.   On: 01-15-2016 18:56   US Ob Transvaginal  15-Jan-2016  CLINICAL DATA:  First trimester of pregnancy, vaginal bleeding. EXAM: OBSTETRIC <14 WK [place] AND TRANSVAGINAL OB US TECHNIQUE: Both transabdominal and transvaginal ultrasound examinations were performed for complete evaluation of the gestation as well  as the maternal uterus, adnexal regions, and pelvic cul-de-sac. Transvaginal technique was performed to assess early pregnancy. COMPARISON:  None. FINDINGS: Intrauterine gestational sac: Visualized. Yolk sac:  Visualized. Embryo:  Visualized. Cardiac Activity: Not visualized. CRL: 21.6 mm 8 w 6 d [place] [facility]: August 04, 2016. Subchorionic hemorrhage:  Small subchronic hemorrhage is noted. Maternal uterus/adnexae: Probable corpus luteum cyst seen in right ovary. Left ovary appears normal. Trace free fluid is noted most likely physiologic. IMPRESSION: Findings meet definitive criteria for failed pregnancy. This follows SRU consensus guidelines: Diagnostic Criteria for Nonviable Pregnancy Early in the First Trimester. [facility] Mis J Med [phone]. Electronically Signed   By: Lupita Raider, M.D.   On: 01/15/2016 18:56    Assessment: Patient Active Problem List   Diagnosis Date Noted  . Missed abortion at [redacted] weeks GA 12/31/2015    Plan:  Patient will undergo surgical management with D&E.   Risks of surgery including bleeding, infection, injury to surrounding organs, need for additional procedures, possibility of intrauterine scarring which may impair future fertility, risk of retained products which may require further management and other postoperative/anesthesia complications were explained to patient.  Likelihood of success of complete evacuation of the uterus was discussed with the patient.  Written informed consent was obtained.  Patient has been NPO since last night  she will remain NPO for procedure. Anesthesia and OR aware.  Preoperative prophylactic Doxycycline 200mg  IV  has been ordered and is on call to the OR.  To OR when ready.    Jaynie Collins, M.D. 12/31/2015 12:05 PM

## 2015-12-31 NOTE — Anesthesia Postprocedure Evaluation (Signed)
Anesthesia Post Note  Patient: Dana Wade  Procedure(s) Performed: Procedure(s) (LRB): DILATATION AND EVACUATION (N/A)  Patient location during evaluation: PACU Anesthesia Type: MAC Level of consciousness: awake and alert Pain management: pain level controlled Vital Signs Assessment: post-procedure vital signs reviewed and stable Respiratory status: spontaneous breathing, nonlabored ventilation, respiratory function stable and patient connected to nasal cannula oxygen Cardiovascular status: blood pressure returned to baseline and stable Postop Assessment: no signs of nausea or vomiting Anesthetic complications: no    Last Vitals:  Filed Vitals:   12/31/15 1415 12/31/15 1505  BP: 105/75 112/69  Pulse: 63 72  Temp: 36.7 C 36.9 C  Resp: 12 16    Last Pain:  Filed Vitals:   12/31/15 1516  PainSc: 0-No pain                 Claudis Giovanelli JENNETTE

## 2015-12-31 NOTE — Op Note (Signed)
Zakya M Munoz-Garcia PROCEDURE DATE: 12/31/2015  PREOPERATIVE DIAGNOSIS: 9 week missed abortion POSTOPERATIVE DIAGNOSIS: The same PROCEDURE:     Dilation and Evacuation SURGEONS:  Dr. Jaynie Collins, Dr. Lyndel Safe  INDICATIONS: 35 y.o. G2P0001 with MAB at [redacted] weeks gestation, needing surgical completion.  Risks of surgery were discussed with the patient including but not limited to: bleeding which may require transfusion; infection which may require antibiotics; injury to uterus or surrounding organs; need for additional procedures including laparotomy or laparoscopy; possibility of intrauterine scarring which may impair future fertility; and other postoperative/anesthesia complications. Written informed consent was obtained.    FINDINGS:  A 9 week size uterus, moderate amounts of products of conception, specimen sent to pathology.  ANESTHESIA:    Monitored intravenous sedation, paracervical block. INTRAVENOUS FLUIDS:  900 ml of LR ESTIMATED BLOOD LOSS:  50 ml. SPECIMENS:  Products of conception sent to pathology COMPLICATIONS:  None immediate.  PROCEDURE DETAILS:  The patient received intravenous Doxycycline while in the preoperative area.  She was then taken to the operating room where monitored intravenous sedation was administered and was found to be adequate.  After an adequate timeout was performed, she was placed in the dorsal lithotomy position and examined; then prepped and draped in the sterile manner.   Her bladder was catheterized for an unmeasured amount of clear, yellow urine. A vaginal speculum was then placed in the patient's vagina and a single tooth tenaculum was applied to the anterior lip of the cervix.  A paracervical block using 30 ml of 0.5% Marcaine was administered. The cervix was gently dilated to accommodate a 9 mm suction curette that was gently advanced to the uterine fundus.  The suction device was then activated and curette slowly rotated to clear the uterus of  products of conception.  The tenaculum was removed but there was significant minimal bleeding from there uterus.  Methergine 0.2 mg IV x 1 was given and good hemostasis was noted.   All instruments were removed from the patient's vagina.  Sponge and instrument counts were correct times two  The patient tolerated the procedure well and was taken to the recovery area awake, and in stable condition.  The patient will be discharged to home as per PACU criteria.  Routine postoperative instructions given.  She was prescribed Percocet, Ibuprofen and Colace.  She will follow up in the clinic in 3-4 weeks for postoperative evaluation.   Jaynie Collins, MD, FACOG Attending Obstetrician & Gynecologist Faculty Practice, [facility]

## 2016-01-03 ENCOUNTER — Encounter (HOSPITAL_COMMUNITY): Payer: Self-pay | Admitting: Obstetrics & Gynecology

## 2016-01-03 NOTE — Addendum Note (Signed)
Addendum  created 01/03/16 1337 by Randa Spike, CRNA   Modules edited: Charges VN

## 2016-02-03 ENCOUNTER — Encounter: Payer: Self-pay | Admitting: Obstetrics & Gynecology

## 2016-02-03 ENCOUNTER — Ambulatory Visit (INDEPENDENT_AMBULATORY_CARE_PROVIDER_SITE_OTHER): Payer: Self-pay | Admitting: Obstetrics & Gynecology

## 2016-02-03 VITALS — BP 113/65 | HR 65 | Temp 98.5°F | Wt 145.0 lb

## 2016-02-03 DIAGNOSIS — Z9889 Other specified postprocedural states: Secondary | ICD-10-CM

## 2016-02-03 NOTE — Progress Notes (Signed)
  Subjective:     Dana Wade is a 35 y.o. 812P1001 female who presents to the clinic 4 weeks status post D&E for MAB at [redacted] weeks GA.  Patient is Spanish-speaking only, Spanish interpreter present for this encounter. Eating a regular diet without difficulty. Bowel movements are normal. The patient is not having any pain.  The following portions of the patient's history were reviewed and updated as appropriate: allergies, current medications, past family history, past medical history, past social history, past surgical history and problem list.  Review of Systems Pertinent items noted in HPI and remainder of comprehensive ROS otherwise negative.    Objective:    BP 113/65 mmHg  Pulse 65  Temp(Src) 98.5 F (36.9 C)  Wt 145 lb (65.772 kg)  LMP 10/30/2015  Breastfeeding? Unknown General:  alert and no distress  Abdomen: soft, bowel sounds active, non-tender  Incision:   N/A    Surgical Pathology Products of Conception - CHORIONIC VILLI IDENTIFIED. - FETAL TISSUE PRESENT.  Assessment:    Doing well postoperatively. Operative findings again reviewed. Pathology report discussed.    Plan:   1. Continue any current medications.  2. Using condoms for contraception 3. Activity restrictions: none 4. Anticipated return to work: now. 5. Follow up as needed.   Jaynie CollinsUGONNA  Rupert Azzara, MD, FACOG Attending Obstetrician & Gynecologist, [facility]

## 2016-02-03 NOTE — Patient Instructions (Signed)
  Thank you for enrolling in MyChart. Please follow the instructions below to securely access your online medical record. MyChart allows you to send messages to your doctor, view your test results, manage appointments, and more.   How Do I Sign Up? 1. In your Internet browser, go to [employer] Address [employer] and enter [email or web address]. 2. Click on the Sign Up Now link in the Sign In box. You will see the New Member Sign Up page. 3. Enter your MyChart Access Code exactly as it appears below. You will not need to use this code after you've completed the sign-up process. If you do not sign up before the expiration date, you must request a new code.  MyChart Access Code: [email or web address] Expires: 02/28/2016  5:41 PM  4. Enter your Social Security Number ([id number]) and Date of Birth (mm/dd/yyyy) as indicated and click Submit. You will be taken to the next sign-up page. 5. Create a MyChart ID. This will be your MyChart login ID and cannot be changed, so think of one that is secure and easy to remember. 6. Create a MyChart password. You can change your password at any time. 7. Enter your Password Reset Question and Answer. This can be used at a later time if you forget your password.  8. Enter your e-mail address. You will receive e-mail notification when new information is available in MyChart. 9. Click Sign Up. You can now view your medical record.   Additional Information Remember, MyChart is NOT to be used for urgent needs. For medical emergencies, dial 911.

## 2016-05-17 ENCOUNTER — Ambulatory Visit (INDEPENDENT_AMBULATORY_CARE_PROVIDER_SITE_OTHER): Payer: Self-pay | Admitting: *Deleted

## 2016-05-17 DIAGNOSIS — Z3200 Encounter for pregnancy test, result unknown: Secondary | ICD-10-CM

## 2016-05-17 DIAGNOSIS — Z3201 Encounter for pregnancy test, result positive: Secondary | ICD-10-CM

## 2016-05-17 LAB — POCT PREGNANCY, URINE: PREG TEST UR: POSITIVE — AB

## 2016-05-17 NOTE — Progress Notes (Signed)
Interpreter Delphina CahillSusan Ross present for encounter.  Pt informed of positive UPT today.  LMP = 04/09/16   Pt states she had a miscarriage 7 months ago - has had normal periods since then and denies bleeding or abdominal pain thus far. She desires to receive prenatal care @ this office.

## 2016-06-19 ENCOUNTER — Encounter (HOSPITAL_COMMUNITY): Payer: Self-pay | Admitting: Vascular Surgery

## 2016-06-19 ENCOUNTER — Emergency Department (HOSPITAL_COMMUNITY)
Admission: EM | Admit: 2016-06-19 | Discharge: 2016-06-19 | Disposition: A | Discharge status: Home / Self Care | Payer: No Typology Code available for payment source | Attending: Emergency Medicine | Admitting: Emergency Medicine

## 2016-06-19 DIAGNOSIS — Z3A35 35 weeks gestation of pregnancy: Secondary | ICD-10-CM | POA: Diagnosis not present

## 2016-06-19 DIAGNOSIS — Y999 Unspecified external cause status: Secondary | ICD-10-CM | POA: Insufficient documentation

## 2016-06-19 DIAGNOSIS — O9A213 Injury, poisoning and certain other consequences of external causes complicating pregnancy, third trimester: Secondary | ICD-10-CM | POA: Insufficient documentation

## 2016-06-19 DIAGNOSIS — R1030 Lower abdominal pain, unspecified: Secondary | ICD-10-CM

## 2016-06-19 DIAGNOSIS — Y939 Activity, unspecified: Secondary | ICD-10-CM | POA: Insufficient documentation

## 2016-06-19 DIAGNOSIS — Y92481 Parking lot as the place of occurrence of the external cause: Secondary | ICD-10-CM | POA: Diagnosis not present

## 2016-06-19 LAB — COMPREHENSIVE METABOLIC PANEL
ALT: 18 U/L (ref 14–54)
AST: 17 U/L (ref 15–41)
Albumin: 4 g/dL (ref 3.5–5.0)
Alkaline Phosphatase: 45 U/L (ref 38–126)
Anion gap: 9 (ref 5–15)
BILIRUBIN TOTAL: 0.4 mg/dL (ref 0.3–1.2)
BUN: 9 mg/dL (ref 6–20)
CALCIUM: 9.8 mg/dL (ref 8.9–10.3)
CO2: 21 mmol/L — ABNORMAL LOW (ref 22–32)
CREATININE: 0.49 mg/dL (ref 0.44–1.00)
Chloride: 106 mmol/L (ref 101–111)
GFR calc Af Amer: >60 mL/min (ref >60)
Glucose, Bld: 84 mg/dL (ref 65–99)
Potassium: 3.8 mmol/L (ref 3.5–5.1)
Sodium: 136 mmol/L (ref 135–145)
TOTAL PROTEIN: 6.6 g/dL (ref 6.5–8.1)

## 2016-06-19 LAB — CBC
HCT: 39.6 % (ref 36.0–46.0)
Hemoglobin: 13.6 g/dL (ref 12.0–15.0)
MCH: 30.8 pg (ref 26.0–34.0)
MCHC: 34.3 g/dL (ref 30.0–36.0)
MCV: 89.6 fL (ref 78.0–100.0)
PLATELETS: 308 10*3/uL (ref 150–400)
RBC: 4.42 MIL/uL (ref 3.87–5.11)
RDW: 12.6 % (ref 11.5–15.5)
WBC: 8.6 10*3/uL (ref 4.0–10.5)

## 2016-06-19 LAB — HCG, QUANTITATIVE, PREGNANCY: hCG, Beta Chain, Quant, S: 176974 m[IU]/mL — ABNORMAL HIGH (ref <5)
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
## 2016-06-19 NOTE — Discharge Instructions (Signed)
Return to the ER immediately if you develop worsening abdominal pain or if it does not go away. If you develop vomiting, vaginal bleeding, abdominal swelling, or other concerning symptoms come back to the ER immediately.
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
## 2016-06-20 ENCOUNTER — Encounter (HOSPITAL_COMMUNITY): Payer: Self-pay | Admitting: *Deleted

## 2016-06-20 ENCOUNTER — Inpatient Hospital Stay (HOSPITAL_COMMUNITY)
Admission: AD | Admit: 2016-06-20 | Discharge: 2016-06-20 | Disposition: A | Discharge status: Home / Self Care | Payer: No Typology Code available for payment source | Source: Ambulatory Visit | Attending: Obstetrics and Gynecology | Admitting: Obstetrics and Gynecology

## 2016-06-20 DIAGNOSIS — Z3A10 10 weeks gestation of pregnancy: Secondary | ICD-10-CM | POA: Diagnosis not present

## 2016-06-20 DIAGNOSIS — O26891 Other specified pregnancy related conditions, first trimester: Secondary | ICD-10-CM | POA: Insufficient documentation

## 2016-06-20 DIAGNOSIS — S3991XD Unspecified injury of abdomen, subsequent encounter: Secondary | ICD-10-CM

## 2016-06-20 DIAGNOSIS — O9A211 Injury, poisoning and certain other consequences of external causes complicating pregnancy, first trimester: Secondary | ICD-10-CM

## 2016-06-20 DIAGNOSIS — Z3491 Encounter for supervision of normal pregnancy, unspecified, first trimester: Secondary | ICD-10-CM

## 2016-06-20 DIAGNOSIS — S3991XA Unspecified injury of abdomen, initial encounter: Secondary | ICD-10-CM

## 2016-06-20 LAB — URINALYSIS, ROUTINE W REFLEX MICROSCOPIC
Glucose, UA: NEGATIVE mg/dL
Hgb urine dipstick: NEGATIVE
KETONES UR: 15 mg/dL — AB
NITRITE: NEGATIVE
PROTEIN: NEGATIVE mg/dL
Specific Gravity, Urine: >1.03 — ABNORMAL HIGH (ref 1.005–1.030)
pH: 5.5 (ref 5.0–8.0)

## 2016-06-20 LAB — URINE MICROSCOPIC-ADD ON

## 2016-06-20 NOTE — Discharge Instructions (Signed)
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
## 2016-07-03 ENCOUNTER — Encounter: Payer: Self-pay | Admitting: Obstetrics & Gynecology

## 2016-07-03 ENCOUNTER — Ambulatory Visit (INDEPENDENT_AMBULATORY_CARE_PROVIDER_SITE_OTHER): Payer: Self-pay | Admitting: Obstetrics & Gynecology

## 2016-07-03 VITALS — BP 114/59 | HR 76 | Wt 146.4 lb

## 2016-07-03 DIAGNOSIS — N898 Other specified noninflammatory disorders of vagina: Secondary | ICD-10-CM

## 2016-07-03 DIAGNOSIS — O26891 Other specified pregnancy related conditions, first trimester: Secondary | ICD-10-CM

## 2016-07-03 DIAGNOSIS — Z3491 Encounter for supervision of normal pregnancy, unspecified, first trimester: Secondary | ICD-10-CM

## 2016-07-03 DIAGNOSIS — Z113 Encounter for screening for infections with a predominantly sexual mode of transmission: Secondary | ICD-10-CM

## 2016-07-03 LAB — POCT URINALYSIS DIP (DEVICE)
Bilirubin Urine: NEGATIVE
Glucose, UA: NEGATIVE mg/dL
HGB URINE DIPSTICK: NEGATIVE
Ketones, ur: NEGATIVE mg/dL
NITRITE: POSITIVE — AB
PH: 5.5 (ref 5.0–8.0)
Protein, ur: NEGATIVE mg/dL
SPECIFIC GRAVITY, URINE: 1.02 (ref 1.005–1.030)
UROBILINOGEN UA: 0.2 mg/dL (ref 0.0–1.0)

## 2016-07-03 MED ORDER — PRENATAL VITAMINS 0.8 MG PO TABS: 1.0000 | ORAL_TABLET | Freq: Every day | ORAL | 12 refills | Status: DC

## 2016-07-03 NOTE — Patient Instructions (Signed)
Primer trimestre de embarazo  (First Trimester of Pregnancy)  El primer trimestre de embarazo se extiende desde la semana 1 hasta el final de la semana 12 (mes 1 al mes 3). Una semana después de que un espermatozoide fecunda un óvulo, este se implantará en la pared uterina. Este embrión comenzará a desarrollarse hasta convertirse en un bebé. Sus genes y los de su pareja forman el bebé. Los genes del varón determinan si será un niño o una niña. Entre la semana 6 y la 8, se forman los ojos y el rostro, y los latidos del corazón pueden verse en la ecografía. Al final de las 12 semanas, todos los órganos del bebé están formados.   Ahora que está embarazada, querrá hacer todo lo que esté a su alcance para tener un bebé sano. Dos de las cosas más importantes son tener una buena atención prenatal y seguir las indicaciones del médico. La atención prenatal incluye toda la asistencia médica que usted recibe antes del nacimiento del bebé. Esta ayudará a prevenir, detectar y tratar cualquier problema durante el embarazo y el parto.  CAMBIOS EN EL ORGANISMO  Su organismo atraviesa por muchos cambios durante el embarazo, y estos varían de una mujer a otra.   · Al principio, puede aumentar o bajar algunos kilos.  · Puede tener malestar estomacal (náuseas) y vomitar. Si no puede controlar los vómitos, llame al médico.  · Puede cansarse con facilidad.  · Es posible que tenga dolores de cabeza que pueden aliviarse con los medicamentos que el médico le permita tomar.  · Puede orinar con mayor frecuencia. El dolor al orinar puede significar que usted tiene una infección de la vejiga.  · Debido al embarazo, puede tener acidez estomacal.  · Puede estar estreñida, ya que ciertas hormonas enlentecen los movimientos de los músculos que empujan los desechos a través de los intestinos.  · Pueden aparecer hemorroides o abultarse e hincharse las venas (venas varicosas).  · Las mamas pueden empezar a agrandarse y estar sensibles. Los pezones  pueden sobresalir más, y el tejido que los rodea (areola) tornarse más oscuro.  · Las encías pueden sangrar y estar sensibles al cepillado y al hilo dental.  · Pueden aparecer zonas oscuras o manchas (cloasma, máscara del embarazo) en el rostro que probablemente se atenuarán después del nacimiento del bebé.  · Los períodos menstruales se interrumpirán.  · Tal vez no tenga apetito.  · Puede sentir un fuerte deseo de consumir ciertos alimentos.  · Puede tener cambios a nivel emocional día a día, por ejemplo, por momentos puede estar emocionada por el embarazo y por otros preocuparse porque algo pueda salir mal con el embarazo o el bebé.  · Tendrá sueños más vívidos y extraños.  · Tal vez haya cambios en el cabello que pueden incluir su engrosamiento, crecimiento rápido y cambios en la textura. A algunas mujeres también se les cae el cabello durante o después del embarazo, o tienen el cabello seco o fino. Lo más probable es que el cabello se le normalice después del nacimiento del bebé.  QUÉ DEBE ESPERAR EN LAS CONSULTAS PRENATALES  Durante una visita prenatal de rutina:  · La pesarán para asegurarse de que usted y el bebé están creciendo normalmente.  · Le controlarán la presión arterial.  · Le medirán el abdomen para controlar el desarrollo del bebé.  · Se escucharán los latidos cardíacos a partir de la semana 10 o la 12 de embarazo, aproximadamente.  · Se analizarán los resultados de los estudios solicitados en visitas anteriores.  El   médico puede preguntarle:  · Cómo se siente.  · Si siente los movimientos del bebé.  · Si ha tenido síntomas anormales, como pérdida de líquido, sangrado, dolores de cabeza intensos o cólicos abdominales.  · Si está consumiendo algún producto que contenga tabaco, como cigarrillos, tabaco de mascar y cigarrillos electrónicos.  · Si tiene alguna pregunta.  Otros estudios que pueden realizarse durante el primer trimestre incluyen lo siguiente:  · Análisis de sangre para determinar el tipo  de sangre y detectar la presencia de infecciones previas. Además, se los usará para controlar si los niveles de hierro son bajos (anemia) y determinar los anticuerpos Rh. En una etapa más avanzada del embarazo, se harán análisis de sangre para saber si tiene diabetes, junto con otros estudios si surgen problemas.  · Análisis de orina para detectar infecciones, diabetes o proteínas en la orina.  · Una ecografía para confirmar que el bebé crece y se desarrolla correctamente.  · Una amniocentesis para diagnosticar posibles problemas genéticos.  · Estudios del feto para descartar espina bífida y síndrome de Down.  · Es posible que necesite otras pruebas adicionales.  · Prueba del VIH (virus de inmunodeficiencia humana). Los exámenes prenatales de rutina incluyen la prueba de detección del VIH, a menos que decida no realizársela.  INSTRUCCIONES PARA EL CUIDADO EN EL HOGAR   Medicamentos:  · Siga las indicaciones del médico en relación con el uso de medicamentos. Durante el embarazo, hay medicamentos que pueden tomarse y otros que no.  · Tome las vitaminas prenatales como se le indicó.  · Si está estreñida, tome un laxante suave, si el médico lo autoriza.  Dieta  · Consuma alimentos balanceados. Elija alimentos variados, como carne o proteínas de origen vegetal, pescado, leche y productos lácteos descremados, verduras, frutas y panes y cereales integrales. El médico la ayudará a determinar la cantidad de peso que puede aumentar.  · No coma carne cruda ni quesos sin cocinar. Estos elementos contienen bacterias que pueden causar defectos congénitos en el bebé.  · La ingesta diaria de cuatro o cinco comidas pequeñas en lugar de tres comidas abundantes puede ayudar a aliviar las náuseas y los vómitos. Si empieza a tener náuseas, comer algunas galletas saladas puede ser de ayuda. Beber líquidos entre las comidas en lugar de tomarlos durante las comidas también puede ayudar a calmar las náuseas y los vómitos.  · Si está  estreñida, consuma alimentos con alto contenido de fibra, como verduras y frutas frescas, y cereales integrales. Beba suficiente líquido para mantener la orina clara o de color amarillo pálido.  Actividad y ejercicios  · Haga ejercicio solamente como se lo haya indicado el médico. El ejercicio la ayudará a:    Controlar el peso.    Mantenerse en forma.    Estar preparada para el trabajo de parto y el parto.  · Los dolores, los cólicos en la parte baja del abdomen o los calambres en la cintura son un buen indicio de que debe dejar de hacer ejercicios. Consulte al médico antes de seguir haciendo ejercicios normales.  · Intente no estar de pie durante mucho tiempo. Mueva las piernas con frecuencia si debe estar de pie en un lugar durante mucho tiempo.  · Evite levantar pesos excesivos.  · Use zapatos de tacones bajos y mantenga una buena postura.  · Puede seguir teniendo relaciones sexuales, excepto que el médico le indique lo contrario.  Alivio del dolor o las molestias  · Use un sostén que le brinde buen   soporte si siente dolor a la palpación en las mamas.  · Dese baños de asiento con agua tibia para aliviar el dolor o las molestias causadas por las hemorroides. Use crema antihemorroidal si el médico se lo permite.  · Descanse con las piernas elevadas si tiene calambres o dolor de cintura.  · Si tiene venas varicosas en las piernas, use medias de descanso. Eleve los pies durante 15 minutos, 3 o 4 veces por día. Limite la cantidad de sal en su dieta.  Cuidados prenatales  · Programe las visitas prenatales para la semana 12 de embarazo. Generalmente se programan cada mes al principio y se hacen más frecuentes en los 2 últimos meses antes del parto.  · Escriba sus preguntas. Llévelas cuando concurra a las visitas prenatales.  · Concurra a todas las visitas prenatales como se lo haya indicado el médico.  Seguridad  · Colóquese el cinturón de seguridad cuando conduzca.  · Haga una lista de los números de teléfono de  emergencia, que incluya los números de teléfono de familiares, amigos, el hospital y los departamentos de policía y bomberos.  Consejos generales  · Pídale al médico que la derive a clases de educación prenatal en su localidad. Debe comenzar a tomar las clases antes de entrar en el mes 6 de embarazo.  · Pida ayuda si tiene necesidades nutricionales o de asesoramiento durante el embarazo. El médico puede aconsejarla o derivarla a especialistas para que la ayuden con diferentes necesidades.  · No se dé baños de inmersión en agua caliente, baños turcos ni saunas.  · No se haga duchas vaginales ni use tampones o toallas higiénicas perfumadas.  · No mantenga las piernas cruzadas durante mucho tiempo.  · Evite el contacto con las bandejas sanitarias de los gatos y la tierra que estos animales usan. Estos elementos contienen bacterias que pueden causar defectos congénitos al bebé y la posible pérdida del feto debido a un aborto espontáneo o muerte fetal.  · No fume, no consuma hierbas ni medicamentos que no hayan sido recetados por el médico. Las sustancias químicas que estos productos contienen afectan la formación y el desarrollo del bebé.  · No consuma ningún producto que contenga tabaco, lo que incluye cigarrillos, tabaco de mascar y cigarrillos electrónicos. Si necesita ayuda para dejar de fumar, consulte al médico. Puede recibir asesoramiento y otro tipo de recursos para dejar de fumar.  · Programe una cita con el dentista. En su casa, lávese los dientes con un cepillo dental blando y pásese el hilo dental con suavidad.  SOLICITE ATENCIÓN MÉDICA SI:   · Tiene mareos.  · Siente cólicos leves, presión en la pelvis o dolor persistente en el abdomen.  · Tiene náuseas, vómitos o diarrea persistentes.  · Tiene secreción vaginal con mal olor.  · Siente dolor al orinar.  · Tiene el rostro, las manos, las piernas o los tobillos más hinchados.  SOLICITE ATENCIÓN MÉDICA DE INMEDIATO SI:   · Tiene fiebre.  · Tiene una pérdida de  líquido por la vagina.  · Tiene sangrado o pequeñas pérdidas vaginales.  · Siente dolor intenso o cólicos en el abdomen.  · Sube o baja de peso rápidamente.  · Vomita sangre de color rojo brillante o material que parezca granos de café.  · Ha estado expuesta a la rubéola y no ha sufrido la enfermedad.  · Ha estado expuesta a la quinta enfermedad o a la varicela.  · Tiene un dolor de cabeza intenso.  · Le falta el aire.  · Sufre   cualquier tipo de traumatismo, por ejemplo, debido a una caída o un accidente automovilístico.     Esta información no tiene como fin reemplazar el consejo del médico. Asegúrese de hacerle al médico cualquier pregunta que tenga.     Document Released: 08/09/2005 Document Revised: 11/20/2014  Elsevier Interactive Patient Education ©2016 [employer]
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
## 2016-07-04 LAB — PRENATAL PROFILE (SOLSTAS)
ANTIBODY SCREEN: NEGATIVE
BASOS ABS: 0 {cells}/uL (ref 0–200)
Basophils Relative: 0 %
EOS ABS: 0 {cells}/uL — AB (ref 15–500)
EOS PCT: 0 %
HCT: 38.5 % (ref 35.0–45.0)
HEMOGLOBIN: 13.2 g/dL (ref 11.7–15.5)
HIV 1&2 Ab, 4th Generation: NONREACTIVE
Hepatitis B Surface Ag: NEGATIVE
LYMPHS PCT: 26 %
Lymphs Abs: 1794 cells/uL (ref 850–3900)
MCH: 30.9 pg (ref 27.0–33.0)
MCHC: 34.3 g/dL (ref 32.0–36.0)
MCV: 90.2 fL (ref 80.0–100.0)
MONOS PCT: 7 %
MPV: 9.4 fL (ref 7.5–12.5)
Monocytes Absolute: 483 cells/uL (ref 200–950)
NEUTROS PCT: 67 %
Neutro Abs: 4623 cells/uL (ref 1500–7800)
PLATELETS: 316 10*3/uL (ref 140–400)
RBC: 4.27 MIL/uL (ref 3.80–5.10)
RDW: 13.6 % (ref 11.0–15.0)
RUBELLA: 1 {index} — AB (ref <0.90)
Rh Type: POSITIVE
WBC: 6.9 10*3/uL (ref 3.8–10.8)

## 2016-07-04 LAB — PAIN MGMT, PROFILE 6 CONF W/O MM, U
6 ACETYLMORPHINE: NEGATIVE ng/mL (ref <10)
ALCOHOL METABOLITES: NEGATIVE ng/mL (ref <500)
Amphetamines: NEGATIVE ng/mL (ref <500)
BARBITURATES: NEGATIVE ng/mL (ref <300)
BENZODIAZEPINES: NEGATIVE ng/mL (ref <100)
COCAINE METABOLITE: NEGATIVE ng/mL (ref <150)
Creatinine: 139.7 mg/dL (ref >=20.0)
METHADONE METABOLITE: NEGATIVE ng/mL (ref <100)
Marijuana Metabolite: NEGATIVE ng/mL (ref <20)
OXYCODONE: NEGATIVE ng/mL (ref <100)
Opiates: NEGATIVE ng/mL (ref <100)
Oxidant: NEGATIVE ug/mL (ref <200)
PHENCYCLIDINE: NEGATIVE ng/mL (ref <25)
Please note:: 0
pH: 6.89 (ref 4.5–9.0)

## 2016-07-04 LAB — GC/CHLAMYDIA PROBE AMP (~~LOC~~) NOT AT ARMC
Chlamydia: NEGATIVE
NEISSERIA GONORRHEA: NEGATIVE

## 2016-07-04 LAB — WET PREP, GENITAL
Trich, Wet Prep: NONE SEEN
Yeast Wet Prep HPF POC: NONE SEEN

## 2016-07-05 ENCOUNTER — Encounter (HOSPITAL_COMMUNITY): Payer: Self-pay | Admitting: Obstetrics & Gynecology

## 2016-07-05 LAB — CULTURE, OB URINE

## 2016-07-05 LAB — HEMOGLOBINOPATHY EVALUATION
HCT: 38.5 % (ref 35.0–45.0)
HEMOGLOBIN: 13.2 g/dL (ref 11.7–15.5)
HGB A2 QUANT: 2.4 % (ref 1.8–3.5)
HGB A: 96.6 % (ref >96.0)
MCH: 30.9 pg (ref 27.0–33.0)
MCV: 90.2 fL (ref 80.0–100.0)
RBC: 4.27 MIL/uL (ref 3.80–5.10)
RDW: 13.6 % (ref 11.0–15.0)

## 2016-07-10 LAB — CYSTIC FIBROSIS DIAGNOSTIC STUDY

## 2016-07-11 DIAGNOSIS — Z3490 Encounter for supervision of normal pregnancy, unspecified, unspecified trimester: Secondary | ICD-10-CM | POA: Insufficient documentation

## 2016-07-12 ENCOUNTER — Ambulatory Visit (HOSPITAL_COMMUNITY): Admission: RE | Admit: 2016-07-12 | Payer: Self-pay | Source: Ambulatory Visit

## 2016-07-12 ENCOUNTER — Ambulatory Visit (HOSPITAL_COMMUNITY)
Admission: RE | Admit: 2016-07-12 | Discharge: 2016-07-12 | Disposition: A | Discharge status: Home / Self Care | Payer: Self-pay | Source: Ambulatory Visit | Attending: Obstetrics & Gynecology | Admitting: Obstetrics & Gynecology

## 2016-07-12 ENCOUNTER — Telehealth: Payer: Self-pay | Admitting: General Practice

## 2016-07-12 ENCOUNTER — Encounter (HOSPITAL_COMMUNITY): Payer: Self-pay

## 2016-07-12 DIAGNOSIS — O09529 Supervision of elderly multigravida, unspecified trimester: Secondary | ICD-10-CM | POA: Insufficient documentation

## 2016-07-12 DIAGNOSIS — Z3A12 12 weeks gestation of pregnancy: Secondary | ICD-10-CM | POA: Insufficient documentation

## 2016-07-12 DIAGNOSIS — Z3491 Encounter for supervision of normal pregnancy, unspecified, first trimester: Secondary | ICD-10-CM

## 2016-07-12 DIAGNOSIS — Z3492 Encounter for supervision of normal pregnancy, unspecified, second trimester: Secondary | ICD-10-CM

## 2016-07-12 DIAGNOSIS — N390 Urinary tract infection, site not specified: Secondary | ICD-10-CM

## 2016-07-12 DIAGNOSIS — O09521 Supervision of elderly multigravida, first trimester: Secondary | ICD-10-CM

## 2016-07-12 DIAGNOSIS — Z315 Encounter for genetic counseling: Secondary | ICD-10-CM | POA: Insufficient documentation

## 2016-07-12 MED ORDER — NITROFURANTOIN MONOHYD MACRO 100 MG PO CAPS: 100.0000 mg | ORAL_CAPSULE | Freq: Two times a day (BID) | ORAL | 0 refills | Status: DC

## 2016-07-12 NOTE — Telephone Encounter (Signed)
Per Dr Debroah LoopArnold, patient has UTI and needs macrobid 100mg  BID x7 sent to pharmacy. Med ordered. Called patient with pacific interpreter [phone] & informed her of results & medication sent to pharmacy. Patient verbalized understanding & had no questions

## 2016-07-12 NOTE — Progress Notes (Signed)
Appointment Date: 07/12/2016 DOB: Aug 27, 1981 Referring Provider: Adam PhenixArnold, James G, MD Attending: Dr. Particia NearingMartha Decker  Dana Wade was seen for genetic counseling because of a maternal age of 35 y.o..  Spanish/English translation was provided by an in-person interpreter.  In summary:  Discussed AMA and associated risk for fetal aneuploidy  Discussed options for screening - declined for now, may consider NIPS later  First screen  Quad screen  NIPS  Ultrasound  Discussed diagnostic testing options - declined  [facility]  Amniocentesis  Reviewed family history concerns - none reported  Carrier screening options - previously performed and wnl  CF  Hemoglobinopathies  She was counseled regarding maternal age and the association with risk for chromosome conditions due to nondisjunction with aging of the ova.   We reviewed chromosomes, nondisjunction, and the associated 1 in 114 risk for fetal aneuploidy related to a maternal age of 35 y.o. at 5935w2d gestation.  She was counseled that the risk for aneuploidy decreases as gestational age increases, accounting for those pregnancies which spontaneously abort.  We specifically discussed Down syndrome (trisomy 8421), trisomies 5813 and 1318, and sex chromosome aneuploidies (47,XXX and 47,XXY) including the common features and prognoses of each.   We reviewed available screening options including First Screen, Quad screen, noninvasive prenatal screening (NIPS)/cell free DNA (cfDNA) screening, and detailed ultrasound.  She was counseled that screening tests are used to modify a patient's a priori risk for aneuploidy, typically based on age. This estimate provides a pregnancy specific risk assessment. We reviewed the benefits and limitations of each option. Specifically, we discussed the conditions for which each test screens, the detection rates, and false positive rates of each. She was also counseled regarding diagnostic testing via [facility] and  amniocentesis. We reviewed the approximate 1 in 300-500 risk for complications from amniocentesis, including spontaneous pregnancy loss. We discussed the possible results that the tests might provide including: positive, negative, unanticipated, and no result. Finally, she was counseled regarding the cost of each option and potential out of pocket expenses.  After consideration of all the options, she declined screening at this time.  She will await her second trimester anatomy ultrasound and consider NIPS at that time if concerns arise.    A nuchal translucency ultrasound was performed today.  The report will be documented separately.  She understands that screening tests cannot rule out all birth defects or genetic syndromes. The patient was advised of this limitation and states she still does not want additional testing at this time.   Both family histories were reviewed and found to be noncontributory for birth defects, intellectual disability, and known genetic conditions. Without further information regarding the provided family history, an accurate genetic risk cannot be calculated. Further genetic counseling is warranted if more information is obtained.  Dana Wade denied exposure to environmental toxins or chemical agents. She denied the use of alcohol, tobacco or street drugs. She denied significant viral illnesses during the course of her pregnancy. Her medical and surgical histories were noncontributory.   I counseled Dana Wade regarding the above risks and available options.  The approximate face-to-face time with the genetic counselor was 60 minutes.  [name], MS,  Certified Genetic Counselor

## 2016-07-31 ENCOUNTER — Ambulatory Visit (INDEPENDENT_AMBULATORY_CARE_PROVIDER_SITE_OTHER): Payer: Self-pay | Admitting: Student

## 2016-07-31 VITALS — BP 93/64 | HR 74 | Wt 148.2 lb

## 2016-07-31 DIAGNOSIS — Z23 Encounter for immunization: Secondary | ICD-10-CM

## 2016-07-31 DIAGNOSIS — Z3492 Encounter for supervision of normal pregnancy, unspecified, second trimester: Secondary | ICD-10-CM

## 2016-07-31 DIAGNOSIS — O09522 Supervision of elderly multigravida, second trimester: Secondary | ICD-10-CM

## 2016-07-31 LAB — POCT URINALYSIS DIP (DEVICE)
Bilirubin Urine: NEGATIVE
Glucose, UA: NEGATIVE mg/dL
HGB URINE DIPSTICK: NEGATIVE
KETONES UR: NEGATIVE mg/dL
NITRITE: NEGATIVE
PH: 6 (ref 5.0–8.0)
PROTEIN: NEGATIVE mg/dL
Specific Gravity, Urine: 1.025 (ref 1.005–1.030)
Urobilinogen, UA: 0.2 mg/dL (ref 0.0–1.0)

## 2016-07-31 NOTE — Patient Instructions (Signed)

## 2016-07-31 NOTE — Progress Notes (Signed)
Used Interpreter Corwin Levinsorita Lindner.

## 2016-07-31 NOTE — Progress Notes (Signed)
   PRENATAL VISIT NOTE  Subjective:  Dana Wade is a 35 y.o. G3P1011 at 7968w1d being seen today for ongoing prenatal care.  She is currently monitored for the following issues for this high-risk pregnancy and has Supervision of low-risk pregnancy; [redacted] weeks gestation of pregnancy; and Advanced maternal age in multigravida on her problem list.  Patient reports no complaints.  Contractions: Not present. Vag. Bleeding: None.  Movement: Present. Denies leaking of fluid.   The following portions of the patient's history were reviewed and updated as appropriate: allergies, current medications, past family history, past medical history, past social history, past surgical history and problem list. Problem list updated.  Objective:   Vitals:   07/31/16 0941  BP: 93/64  Pulse: 74  Weight: 148 lb 3.2 oz (67.2 kg)    Fetal Status: Fetal Heart Rate (bpm): 143   Movement: Present     General:  Alert, oriented and cooperative. Patient is in no acute distress.  Skin: Skin is warm and dry. No rash noted.   Cardiovascular: Normal heart rate noted  Respiratory: Normal respiratory effort, no problems with respiration noted  Abdomen: Soft, gravid, appropriate for gestational age. Pain/Pressure: Absent     Pelvic:  Cervical exam deferred        Extremities: Normal range of motion.  Edema: None  Mental Status: Normal mood and affect. Normal behavior. Normal judgment and thought content.   Urinalysis: Urine Protein: Negative Urine Glucose: Negative  Assessment and Plan:  Pregnancy: G3P1011 at 6668w1d  1. Supervision of low-risk pregnancy, second trimester  - Flu Vaccine QUAD 36+ mos IM - US MFM OB COMP + 14 WK; Future  Preterm labor symptoms and general obstetric precautions including but not limited to vaginal bleeding, contractions, leaking of fluid and fetal movement were reviewed in detail with the patient. Please refer to After Visit Summary for other counseling recommendations.  Return  in about 4 weeks (around 08/28/2016) for Routine OB.  Judeth HornErin Kashina Mecum, NP

## 2016-08-06 ENCOUNTER — Inpatient Hospital Stay (HOSPITAL_COMMUNITY)
Admission: AD | Admit: 2016-08-06 | Discharge: 2016-08-06 | Disposition: A | Discharge status: Home / Self Care | Payer: Self-pay | Source: Ambulatory Visit | Attending: Obstetrics & Gynecology | Admitting: Obstetrics & Gynecology

## 2016-08-06 ENCOUNTER — Encounter (HOSPITAL_COMMUNITY): Payer: Self-pay

## 2016-08-06 DIAGNOSIS — O4692 Antepartum hemorrhage, unspecified, second trimester: Secondary | ICD-10-CM

## 2016-08-06 DIAGNOSIS — O209 Hemorrhage in early pregnancy, unspecified: Secondary | ICD-10-CM | POA: Insufficient documentation

## 2016-08-06 DIAGNOSIS — Z3A15 15 weeks gestation of pregnancy: Secondary | ICD-10-CM | POA: Insufficient documentation

## 2016-08-06 LAB — WET PREP, GENITAL
CLUE CELLS WET PREP: NONE SEEN
SPERM: NONE SEEN
TRICH WET PREP: NONE SEEN
Yeast Wet Prep HPF POC: NONE SEEN

## 2016-08-06 NOTE — Discharge Instructions (Signed)
Reposo plvico  (Pelvic Rest) El reposo plvico se recomienda a las mujeres cuando:   La placenta cubre parcial o completamente la abertura del cuello del tero (placenta previa).  Hay sangrado entre la pared del tero y el saco amnitico en el primer trimestre (hemorragia subcorinica).  El cuello uterino comienza a abrirse sin iniciarse el trabajo de parto (cuello uterino incompetente, insuficiencia cervical).  El [place] de parto se inicia muy pronto (parto prematuro). INSTRUCCIONES PARA EL CUIDADO EN EL HOGAR   No tenga relaciones sexuales, estimulacin, ni orgasmos.  No use tampones, no se haga duchas vaginales ni coloque ningn objeto en la vagina.  No levante objetos que pesen ms de 10 libras (4,5 kg).  Evite las actividades extenuantes o tensionar los msculos de la pelvis. SOLICITE ATENCIN MDICA SI:   Tiene un sangrado vaginal durante el embarazo. Considrelo como una posible emergencia.  Siente clicos en la zona baja del estmago (ms fuertes que los clicos menstruales).  Nota flujo vaginal (acuoso, con moco o [place]).  Siente un dolor en la espalda leve y sordo.  Tiene contracciones regulares o endurecimiento del tero. SOLICITE ATENCIN MDICA DE INMEDIATO SI:  Observa sangrado vaginal y tiene placenta previa.    Esta informacin no tiene Theme park manager el consejo del mdico. Asegrese de hacerle al mdico cualquier pregunta que tenga.   Document Released: 07/24/2012 Elsevier Interactive Patient Education 2016 [employer].   Hemorragia vaginal durante el embarazo (segundo trimestre) (Vaginal Bleeding During Pregnancy, Second Trimester)  Durante el Psychiatrist, es comn tener una pequea hemorragia vaginal (manchas). A veces, la hemorragia es normal y no representa un problema, pero en algunas ocasiones es un sntoma de algo grave. Asegrese de decirle a su mdico de inmediato si tiene algn tipo de hemorragia vaginal. CUIDADOS EN EL HOGAR  Controle  su afeccin para ver si hay cambios.  Siga las indicaciones de su mdico con respecto al Shopiere de actividad que [place].  Si debe hacer reposo en cama:  Es posible que deba quedarse en cama y levantarse nicamente para ir al bao.  Quizs le permitan hacer [employer].  Si es necesario, planifique que alguien la ayude.  M[place]:  La cantidad de toallas higinicas que [place] cada da.  La frecuencia con la que se cambia las toallas higinicas.  Indique que tan empapados (saturados) estn.  No use tampones.  No se haga duchas vaginales.  No tenga relaciones sexuales ni orgasmos hasta que el mdico la autorice.  Si elimina tejido por la vagina, gurdelo para mostrrselo al [employer].  Tome los medicamentos solamente como se lo haya indicado el mdico.  No tome aspirina, ya que puede causar hemorragias.  No haga ejercicios, no levante objetos pesados ni haga ninguna actividad que exija mucha energa y esfuerzo, salvo que su mdico la autorice.  Concurra a todas las visitas de control como se lo haya indicado el mdico. SOLICITE AYUDA SI:   Tiene una hemorragia vaginal.  Tiene clicos.  Tiene dolores de [place].  Tiene fiebre que no desaparece despus de Teacher, adult education. SOLICITE AYUDA DE INMEDIATO SI:  Siente clicos muy intensos en la espalda o en el vientre (abdomen).  Siente contracciones.  Tiene escalofros.  Elimina cogulos grandes o tejido por la vagina.  Tiene ms hemorragia.  Se siente dbil o que va a desvanecerse.  Pierde el conocimiento (se desmaya).  Tiene una prdida importante o sale lquido a borbotones por la vagina. ASEGRESE DE QUE:  Comprende estas instrucciones.  Controlar su  afeccin.  Recibir ayuda de inmediato si no mejora o si empeora.   Esta informacin no tiene Theme park managercomo fin reemplazar el consejo del mdico. Asegrese de hacerle al mdico cualquier pregunta que tenga.   Document Released: 03/16/2014 Elsevier  Interactive Patient Education [employer]2016 Elsevier Inc.

## 2016-08-06 NOTE — MAU Provider Note (Signed)
Chief Complaint:  Vaginal Bleeding   First Provider Initiated Contact with Patient 08/06/16 1427     HPI  HPI: Dana Wade is a 35 y.o. G3P1011 at 53w6dwho presents to maternity admissions reporting spotting half hour ago.  Also has some burning with urination.. She reports good fetal movement, denies LOF, vaginal itching/burning, h/a, dizziness, n/v, diarrhea, constipation or fever/chills.  She denies headache, visual changes or RUQ abdominal pain.  RN Note: Pt presents to MAU with vaginal spotting and burning with urination and urgency. Began about 30 min ago. Seen small amount of blood on toilet paper, has had none since. Denies cramping, abnormal discharge or leaking of fluid.   Past Medical History: Past Medical History:  Diagnosis Date  . Infection    kidney infection 4-5 years ago  . Medical history non-contributory     Past obstetric history: OB History  Gravida Para Term Preterm AB Living  3 1 1  0 1 1  SAB TAB Ectopic Multiple Live Births  1 0 0 0 1    # Outcome Date GA Lbr Len/2nd Weight Sex Delivery Anes PTL Lv  3 Current           2 SAB 12/2015 [redacted]w[redacted]d         1 Term 10/05/05    F Vag-Spont   LIV      Past Surgical History: Past Surgical History:  Procedure Laterality Date  . DILATION AND EVACUATION N/A 12/31/2015   Procedure: DILATATION AND EVACUATION;  Surgeon: Tereso Newcomer, MD;  Location: [facility];  Service: Gynecology;  Laterality: N/A;  . INCISION AND DRAINAGE POSTERIOR SACRAL SPINE  2006   fell while pregnant, had large hematoma    Family History: Family History  Problem Relation Age of Onset  . Hypertension Father     Social History: Social History  Substance Use Topics  . Smoking status: Never Smoker  . Smokeless tobacco: Never Used  . Alcohol use No    Allergies: No Known Allergies  Meds:  Prescriptions Prior to Admission  Medication Sig Dispense Refill Last Dose  . acetaminophen (TYLENOL) 325 MG tablet Take 650 mg by mouth  every 6 (six) hours as needed.   Taking  . Prenatal Multivit-Min-Fe-FA (PRENATAL VITAMINS) 0.8 MG tablet Take 1 tablet by mouth daily. 30 tablet 12 Taking    I have reviewed patient's Past Medical Hx, Surgical Hx, Family Hx, Social Hx, medications and allergies.   ROS:  Review of Systems Other systems negative  Physical Exam  Patient Vitals for the past 24 hrs:  BP Temp Temp src Pulse Resp  08/06/16 1423 95/58 98.8 F (37.1 C) Oral 82 16   Constitutional: Well-developed, well-nourished female in no acute distress.  Cardiovascular: normal rate and rhythm Respiratory: normal effort, clear to auscultation bilaterally GI: Abd soft, non-tender, gravid appropriate for gestational age.   No rebound or guarding. MS: Extremities nontender, no edema, normal ROM Neurologic: Alert and oriented x 4.  GU: Neg CVAT.  PELVIC EXAM: Cervix pink, visually closed, without lesion, scant white creamy discharge, no blood seen,  vaginal walls and external genitalia normal Bimanual exam: Cervix firm, posterior, neg CMT, uterus nontender, Fundal Height consistent with dates, adnexa without tenderness, enlargement, or mass    FHR 160   Labs: No results found for this or any previous visit (from the past 24 hour(s)). O/POS/-- (08/21 1050) Results for orders placed or performed during the hospital encounter of 08/06/16 (from the past 72 hour(s))  Wet prep,  genital     Status: Abnormal   Collection Time: 08/06/16  2:44 PM  Result Value Ref Range   Yeast Wet Prep HPF POC NONE SEEN NONE SEEN   Trich, Wet Prep NONE SEEN NONE SEEN   Clue Cells Wet Prep HPF POC NONE SEEN NONE SEEN   WBC, Wet Prep HPF POC MANY (A) NONE SEEN    Comment: MANY BACTERIA SEEN   Sperm NONE SEEN     Imaging:  [place] Mfm Fetal Nuchal Translucency  Result Date: 07/12/2016 OBSTETRICAL ULTRASOUND: This exam was performed within a [place] [facility] Department. The OB US report was generated in the [facility] system, and faxed to the  ordering physician.  This report is available in the [employer] PACS. See the [facility] Obstetric US report via the Image Link.   MAU Course/MDM: I have ordered labs and reviewed results.  Bedside US done for reassurance of fetal well being   Assessment: SIUP at 9168w5d Second trimester bleeding, unknown etiology  Plan: Discharge home Pelvic rest Monitor for recurrence of bleeding Follow up in Office for prenatal visits and recheck of status  Encouraged to return here or to other Urgent Care/ED if she develops worsening of symptoms, increase in pain, fever, or other concerning symptoms.       Pt stable at time of discharge.  Wynelle BourgeoisMarie Oluwaseyi Raffel CNM, MSN Certified Nurse-Midwife 08/06/2016 2:28 PM

## 2016-08-06 NOTE — MAU Note (Signed)
Pt presents to MAU with vaginal spotting and burning with urination and urgency. Began about 30 min ago. Seen small amount of blood on toilet paper, has had none since. Denies cramping, abnormal discharge or leaking of fluid.

## 2016-08-18 ENCOUNTER — Encounter (HOSPITAL_COMMUNITY): Payer: Self-pay

## 2016-08-21 ENCOUNTER — Encounter (HOSPITAL_COMMUNITY): Payer: Self-pay

## 2016-08-21 ENCOUNTER — Other Ambulatory Visit: Payer: Self-pay | Admitting: Student

## 2016-08-21 ENCOUNTER — Ambulatory Visit (HOSPITAL_COMMUNITY)
Admission: RE | Admit: 2016-08-21 | Discharge: 2016-08-21 | Disposition: A | Discharge status: Home / Self Care | Payer: Self-pay | Source: Ambulatory Visit | Attending: Student | Admitting: Student

## 2016-08-21 VITALS — BP 101/57 | HR 68 | Wt 150.6 lb

## 2016-08-21 DIAGNOSIS — O09529 Supervision of elderly multigravida, unspecified trimester: Secondary | ICD-10-CM

## 2016-08-21 DIAGNOSIS — Z3492 Encounter for supervision of normal pregnancy, unspecified, second trimester: Secondary | ICD-10-CM

## 2016-08-21 DIAGNOSIS — Z363 Encounter for antenatal screening for malformations: Secondary | ICD-10-CM

## 2016-08-21 DIAGNOSIS — Z3A18 18 weeks gestation of pregnancy: Secondary | ICD-10-CM | POA: Insufficient documentation

## 2016-08-21 DIAGNOSIS — O09522 Supervision of elderly multigravida, second trimester: Secondary | ICD-10-CM

## 2016-08-28 ENCOUNTER — Ambulatory Visit (INDEPENDENT_AMBULATORY_CARE_PROVIDER_SITE_OTHER): Payer: Self-pay | Admitting: Obstetrics & Gynecology

## 2016-08-28 VITALS — BP 106/54 | HR 77 | Wt 155.2 lb

## 2016-08-28 DIAGNOSIS — O09522 Supervision of elderly multigravida, second trimester: Secondary | ICD-10-CM

## 2016-08-28 DIAGNOSIS — Z3492 Encounter for supervision of normal pregnancy, unspecified, second trimester: Secondary | ICD-10-CM

## 2016-08-28 NOTE — Patient Instructions (Signed)
Return to clinic for any scheduled appointments or obstetric concerns, or go to MAU for evaluation  

## 2016-08-28 NOTE — Progress Notes (Signed)
   PRENATAL VISIT NOTE  Subjective:  Dana Wade is a 35 y.o. G3P1011 at 3721w0d being seen today for ongoing prenatal care.  She is currently monitored for the following issues for this low-risk pregnancy and has Supervision of low-risk pregnancy and Advanced maternal age in multigravida on her problem list. Patient is Spanish-speaking only, Spanish interpreter present for this encounter.  Patient reports no complaints.  Contractions: Not present. Vag. Bleeding: None.  Movement: Present. Denies leaking of fluid.   The following portions of the patient's history were reviewed and updated as appropriate: allergies, current medications, past family history, past medical history, past social history, past surgical history and problem list. Problem list updated.  Objective:   Vitals:   08/28/16 0841  BP: (!) 106/54  Pulse: 77  Weight: 155 lb 3.2 oz (70.4 kg)    Fetal Status: Fetal Heart Rate (bpm): 135 Fundal Height: 19 cm Movement: Present     General:  Alert, oriented and cooperative. Patient is in no acute distress.  Skin: Skin is warm and dry. No rash noted.   Cardiovascular: Normal heart rate noted  Respiratory: Normal respiratory effort, no problems with respiration noted  Abdomen: Soft, gravid, appropriate for gestational age. Pain/Pressure: Absent     Pelvic:  Cervical exam deferred        Extremities: Normal range of motion.  Edema: None  Mental Status: Normal mood and affect. Normal behavior. Normal judgment and thought content.    Assessment and Plan:  Pregnancy: G3P1011 at 2721w0d  1. Elderly multigravida in second trimester Declined any genetic testing. Normal anatomy scan.  2. Encounter for supervision of low-risk pregnancy in second trimester No other complaints or concerns.  Routine obstetric precautions reviewed. Please refer to After Visit Summary for other counseling recommendations.  Return in about 4 weeks (around 09/25/2016) for OB Visit.  Tereso NewcomerUgonna A  Anyanwu, MD

## 2016-09-25 ENCOUNTER — Ambulatory Visit (INDEPENDENT_AMBULATORY_CARE_PROVIDER_SITE_OTHER): Payer: Self-pay | Admitting: Obstetrics and Gynecology

## 2016-09-25 VITALS — BP 105/58 | HR 70 | Wt 157.0 lb

## 2016-09-25 DIAGNOSIS — O09522 Supervision of elderly multigravida, second trimester: Secondary | ICD-10-CM

## 2016-09-25 DIAGNOSIS — Z3492 Encounter for supervision of normal pregnancy, unspecified, second trimester: Secondary | ICD-10-CM

## 2016-09-25 NOTE — Progress Notes (Signed)
   PRENATAL VISIT NOTE  Subjective:  Dana Wade is a 35 y.o. G3P1011 at 1551w0d being seen today for ongoing prenatal care.  She is currently monitored for the following issues for this low-risk pregnancy and has Supervision of low-risk pregnancy and Advanced maternal age in multigravida on her problem list.  Patient reports no complaints.  Contractions: Not present. Vag. Bleeding: None.  Movement: Present. Denies leaking of fluid.   The following portions of the patient's history were reviewed and updated as appropriate: allergies, current medications, past family history, past medical history, past social history, past surgical history and problem list. Problem list updated.  Objective:   Vitals:   09/25/16 0920  BP: (!) 105/58  Pulse: 70  Weight: 157 lb (71.2 kg)    Fetal Status: Fetal Heart Rate (bpm): 144   Movement: Present     General:  Alert, oriented and cooperative. Patient is in no acute distress.  Skin: Skin is warm and dry. No rash noted.   Cardiovascular: Normal heart rate noted  Respiratory: Normal respiratory effort, no problems with respiration noted  Abdomen: Soft, gravid, appropriate for gestational age. Pain/Pressure: Absent     Pelvic:  Cervical exam deferred        Extremities: Normal range of motion.  Edema: None  Mental Status: Normal mood and affect. Normal behavior. Normal judgment and thought content.   Assessment and Plan:  Pregnancy: G3P1011 at 8251w0d  1. Elderly multigravida in second trimester - F/U growth ultrasound scheduled in October 30, 2016 per MFM recommendation   2. Encounter for supervision of low-risk pregnancy in second trimester - discussed TDAP vaccine- will plan for next visit - discussed GTT test.   Preterm labor symptoms and general obstetric precautions including but not limited to vaginal bleeding, contractions, leaking of fluid and fetal movement were reviewed in detail with the patient. Please refer to After Visit  Summary for other counseling recommendations.  Return in about 4 weeks (around 10/23/2016).   Duane LopeJennifer I Rasch, NP

## 2016-10-23 ENCOUNTER — Ambulatory Visit (INDEPENDENT_AMBULATORY_CARE_PROVIDER_SITE_OTHER): Payer: Self-pay | Admitting: Student

## 2016-10-23 VITALS — BP 104/59 | HR 73 | Wt 160.3 lb

## 2016-10-23 DIAGNOSIS — Z3492 Encounter for supervision of normal pregnancy, unspecified, second trimester: Secondary | ICD-10-CM

## 2016-10-23 DIAGNOSIS — Z23 Encounter for immunization: Secondary | ICD-10-CM

## 2016-10-23 MED ORDER — TETANUS-DIPHTH-ACELL PERTUSSIS 5-2.5-18.5 LF-MCG/0.5 IM SUSP
0.5000 mL | Freq: Once | INTRAMUSCULAR | Status: AC
  Administered 2016-10-23: 0.5 mL via INTRAMUSCULAR

## 2016-10-23 NOTE — Patient Instructions (Signed)

## 2016-10-23 NOTE — Progress Notes (Addendum)
   PRENATAL VISIT NOTE  Subjective:  Dana Wade is a 35 y.o. G3P1011 at 8672w0d being seen today for ongoing prenatal care.  She is currently monitored for the following issues for this low-risk pregnancy and has Supervision of low-risk pregnancy and Advanced maternal age in multigravida on her problem list.  Patient reports no complaints.  Contractions: Not present. Vag. Bleeding: None.  Movement: Present. Denies leaking of fluid.   The following portions of the patient's history were reviewed and updated as appropriate: allergies, current medications, past family history, past medical history, past social history, past surgical history and problem list. Problem list updated.  Objective:   Vitals:   10/23/16 0801  BP: (!) 104/59  Pulse: 73  Weight: 160 lb 4.8 oz (72.7 kg)    Fetal Status: Fetal Heart Rate (bpm): 150 Fundal Height: 29 cm Movement: Present     General:  Alert, oriented and cooperative. Patient is in no acute distress.  Skin: Skin is warm and dry. No rash noted.   Cardiovascular: Normal heart rate noted  Respiratory: Normal respiratory effort, no problems with respiration noted  Abdomen: Soft, gravid, appropriate for gestational age. Pain/Pressure: Absent     Pelvic:  Cervical exam deferred        Extremities: Normal range of motion.  Edema: None  Mental Status: Normal mood and affect. Normal behavior. Normal judgment and thought content.   Assessment and Plan:  Pregnancy: G3P1011 at 5072w0d  1. Encounter for supervision of low-risk pregnancy in second trimester -Tdap today -patient has no questions, feeling good -reviewed fetal movements, preterm labor precuations -reviewed GTT for next visit  -patient planning to keep her appointment for growth US on 12/18  Preterm labor symptoms and general obstetric precautions including but not limited to vaginal bleeding, contractions, leaking of fluid and fetal movement were reviewed in detail with the  patient. Please refer to After Visit Summary for other counseling recommendations.  No Follow-up on file. Return in 1 week for 2 hour GTT  Dana Wade, [place]M

## 2016-10-23 NOTE — Addendum Note (Signed)
Addended by: Gerome ApleyZEYFANG, Burch Marchuk L on: 10/23/2016 08:59 AM   Modules accepted: Orders

## 2016-10-23 NOTE — Progress Notes (Signed)
Used [employer] Video interpreter Helmut Musterlicia [phone].

## 2016-10-30 ENCOUNTER — Encounter (HOSPITAL_COMMUNITY): Payer: Self-pay

## 2016-10-30 ENCOUNTER — Ambulatory Visit (HOSPITAL_COMMUNITY)
Admission: RE | Admit: 2016-10-30 | Discharge: 2016-10-30 | Disposition: A | Discharge status: Home / Self Care | Payer: Self-pay | Source: Ambulatory Visit | Attending: Student | Admitting: Student

## 2016-10-30 DIAGNOSIS — Z3A28 28 weeks gestation of pregnancy: Secondary | ICD-10-CM | POA: Insufficient documentation

## 2016-10-30 DIAGNOSIS — O09522 Supervision of elderly multigravida, second trimester: Secondary | ICD-10-CM | POA: Insufficient documentation

## 2016-10-30 DIAGNOSIS — O09529 Supervision of elderly multigravida, unspecified trimester: Secondary | ICD-10-CM

## 2016-11-07 ENCOUNTER — Other Ambulatory Visit: Payer: Self-pay

## 2016-11-07 DIAGNOSIS — Z3493 Encounter for supervision of normal pregnancy, unspecified, third trimester: Secondary | ICD-10-CM

## 2016-11-07 LAB — 2HR GTT W 1 HR, CARPENTER, 75 G
GLUCOSE, 1 HR, GEST: 143 mg/dL (ref <180)
GLUCOSE, 2 HR, GEST: 85 mg/dL (ref <153)
Glucose, Fasting, Gest: 83 mg/dL (ref 65–91)

## 2016-11-07 LAB — CBC
HCT: 36.9 % (ref 35.0–45.0)
HEMOGLOBIN: 12.3 g/dL (ref 11.7–15.5)
MCH: 31.9 pg (ref 27.0–33.0)
MCHC: 33.3 g/dL (ref 32.0–36.0)
MCV: 95.6 fL (ref 80.0–100.0)
MPV: 9.4 fL (ref 7.5–12.5)
PLATELETS: 273 10*3/uL (ref 140–400)
RBC: 3.86 MIL/uL (ref 3.80–5.10)
RDW: 14 % (ref 11.0–15.0)
WBC: 8.4 10*3/uL (ref 3.8–10.8)

## 2016-11-07 LAB — HIV ANTIBODY (ROUTINE TESTING W REFLEX): HIV: NONREACTIVE

## 2016-11-08 LAB — RPR

## 2016-11-13 NOTE — L&D Delivery Note (Signed)
Delivery Note At 4:42 PM a viable female was delivered via Vaginal, Spontaneous Delivery (Presentation:vertex ;LOA  ).  APGAR: 9, 9; weight  .   Placenta status: spont,shultz .  Cord:3vc  with the following complications: none .  Cord pH: n/a  Anesthesia: local  Episiotomy: None Lacerations: 1st degree;Perineal Suture Repair: 2.0 vicryl Est. Blood Loss (mL): 200  Mom to postpartum.  Baby to Couplet care / Skin to Skin.  Dana Wade 01/28/2017, 4:54 PM

## 2016-11-20 ENCOUNTER — Ambulatory Visit (INDEPENDENT_AMBULATORY_CARE_PROVIDER_SITE_OTHER): Payer: Self-pay | Admitting: Advanced Practice Midwife

## 2016-11-20 DIAGNOSIS — O09523 Supervision of elderly multigravida, third trimester: Secondary | ICD-10-CM

## 2016-11-20 DIAGNOSIS — Z3493 Encounter for supervision of normal pregnancy, unspecified, third trimester: Secondary | ICD-10-CM

## 2016-11-20 NOTE — Patient Instructions (Signed)
Tercer trimestre de embarazo (Third Trimester of Pregnancy) El tercer trimestre comprende desde la semana29 hasta la semana42, es decir, desde el mes7 hasta el mes9. El tercer trimestre es un perodo en el que el feto crece rpidamente. Hacia el final del noveno mes, el feto mide alrededor de 20pulgadas (45cm) de largo y pesa entre 6 y 10 libras (2,700 y 4,500kg). CAMBIOS EN EL ORGANISMO Su organismo atraviesa por muchos cambios durante el embarazo, y estos varan de una mujer a otra.  Seguir aumentando de peso. Es de esperar que aumente entre 25 y 35libras (11 y 16kg) hacia el final del embarazo.  Podrn aparecer las primeras estras en las caderas, el abdomen y las mamas.  Puede tener necesidad de orinar con ms frecuencia porque el feto baja hacia la pelvis y ejerce presin sobre la vejiga.  Debido al embarazo podr sentir acidez estomacal con frecuencia.  Puede estar estreida, ya que ciertas hormonas enlentecen los movimientos de los msculos que empujan los desechos a travs de los intestinos.  Pueden aparecer hemorroides o abultarse e hincharse las venas (venas varicosas).  Puede sentir dolor plvico debido al aumento de peso y a que las hormonas del embarazo relajan las articulaciones entre los huesos de la pelvis. El dolor de espalda puede ser consecuencia de la sobrecarga de los msculos que soportan la postura.  Tal vez haya cambios en el cabello que pueden incluir su engrosamiento, crecimiento rpido y cambios en la textura. Adems, a algunas mujeres se les cae el cabello durante o despus del embarazo, o tienen el cabello seco o fino. Lo ms probable es que el cabello se le normalice despus del nacimiento del beb.  Las mamas seguirn creciendo y le dolern. A veces, puede haber una secrecin amarilla de las mamas llamada calostro.  El ombligo puede salir hacia afuera.  Puede sentir que le falta el aire debido a que se expande el tero.  Puede notar que el feto  "baja" o lo siente ms bajo, en el abdomen.  Puede tener una prdida de secrecin mucosa con sangre. Esto suele ocurrir en el trmino de unos pocos das a una semana antes de que comience el trabajo de parto.  El cuello del tero se vuelve delgado y blando (se borra) cerca de la fecha de parto. QU DEBE ESPERAR EN LOS EXMENES PRENATALES Le harn exmenes prenatales cada 2semanas hasta la semana36. A partir de ese momento le harn exmenes semanales. Durante una visita prenatal de rutina:  La pesarn para asegurarse de que usted y el feto estn creciendo normalmente.  Le tomarn la presin arterial.  Le medirn el abdomen para controlar el desarrollo del beb.  Se escucharn los latidos cardacos fetales.  Se evaluarn los resultados de los estudios solicitados en visitas anteriores.  Le revisarn el cuello del tero cuando est prxima la fecha de parto para controlar si este se ha borrado. Alrededor de la semana36, el mdico le revisar el cuello del tero. Al mismo tiempo, realizar un anlisis de las secreciones del tejido vaginal. Este examen es para determinar si hay un tipo de bacteria, estreptococo Grupo B. El mdico le explicar esto con ms detalle. El mdico puede preguntarle lo siguiente:  Cmo le gustara que fuera el parto.  Cmo se siente.  Si siente los movimientos del beb.  Si ha tenido sntomas anormales, como prdida de lquido, sangrado, dolores de cabeza intensos o clicos abdominales.  Si est consumiendo algn producto que contenga tabaco, como cigarrillos, tabaco de mascar y   cigarrillos electrnicos.  Si tiene alguna pregunta. Otros exmenes o estudios de deteccin que pueden realizarse durante el tercer trimestre incluyen lo siguiente:  Anlisis de sangre para controlar los niveles de hierro (anemia).  Controles fetales para determinar su salud, nivel de actividad y crecimiento. Si tiene alguna enfermedad o hay problemas durante el embarazo, le harn  estudios.  Prueba del VIH (virus de inmunodeficiencia humana). Si corre un riesgo alto, pueden realizarle una prueba de deteccin del VIH durante el tercer trimestre del embarazo. FALSO TRABAJO DE PARTO Es posible que sienta contracciones leves e irregulares que finalmente desaparecen. Se llaman contracciones de Braxton Hicks o falso trabajo de parto. Las contracciones pueden durar horas, das o incluso semanas, antes de que el verdadero trabajo de parto se inicie. Si las contracciones ocurren a intervalos regulares, se intensifican o se hacen dolorosas, lo mejor es que la revise el mdico. SIGNOS DE TRABAJO DE PARTO  Clicos de tipo menstrual.  Contracciones cada 5minutos o menos.  Contracciones que comienzan en la parte superior del tero y se extienden hacia abajo, a la zona inferior del abdomen y la espalda.  Sensacin de mayor presin en la pelvis o dolor de espalda.  Una secrecin de mucosidad acuosa o con sangre que sale de la vagina. Si tiene alguno de estos signos antes de la semana37 del embarazo, llame a su mdico de inmediato. Debe concurrir al hospital para que la controlen inmediatamente. INSTRUCCIONES PARA EL CUIDADO EN EL HOGAR  Evite fumar, consumir hierbas, beber alcohol y tomar frmacos que no le hayan recetado. Estas sustancias qumicas afectan la formacin y el desarrollo del beb.  No consuma ningn producto que contenga tabaco, lo que incluye cigarrillos, tabaco de mascar y cigarrillos electrnicos. Si necesita ayuda para dejar de fumar, consulte al mdico. Puede recibir asesoramiento y otro tipo de recursos para dejar de fumar.  Siga las indicaciones del mdico en relacin con el uso de medicamentos. Durante el embarazo, hay medicamentos que son seguros de tomar y otros que no.  Haga ejercicio solamente como se lo haya indicado el mdico. Sentir clicos uterinos es un buen signo para detener la actividad fsica.  Contine comiendo alimentos sanos con  regularidad.  Use un sostn que le brinde buen soporte si le duelen las mamas.  No se d baos de inmersin en agua caliente, baos turcos ni saunas.  Use el cinturn de seguridad en todo momento mientras conduce.  No coma carne cruda ni queso sin cocinar; evite el contacto con las bandejas sanitarias de los gatos y la tierra que estos animales usan. Estos elementos contienen grmenes que pueden causar defectos congnitos en el beb.  Tome las vitaminas prenatales.  Tome entre 1500 y 2000mg de calcio diariamente comenzando en la semana20 del embarazo hasta el parto.  Si est estreida, pruebe un laxante suave (si el mdico lo autoriza). Consuma ms alimentos ricos en fibra, como vegetales y frutas frescos y cereales integrales. Beba gran cantidad de lquido para mantener la orina de tono claro o color amarillo plido.  Dese baos de asiento con agua tibia para aliviar el dolor o las molestias causadas por las hemorroides. Use una crema para las hemorroides si el mdico la autoriza.  Si tiene venas varicosas, use medias de descanso. Eleve los pies durante 15minutos, 3 o 4veces por da. Limite el consumo de sal en su dieta.  Evite levantar objetos pesados, use zapatos de tacones bajos y mantenga una buena postura.  Descanse con las piernas elevadas si tiene   calambres o dolor de cintura.  Visite a su dentista si no lo ha hecho durante el embarazo. Use un cepillo de dientes blando para higienizarse los dientes y psese el hilo dental con suavidad.  Puede seguir manteniendo relaciones sexuales, a menos que el mdico le indique lo contrario.  No haga viajes largos excepto que sea absolutamente necesario y solo con la autorizacin del mdico.  Tome clases prenatales para entender, practicar y hacer preguntas sobre el trabajo de parto y el parto.  Haga un ensayo de la partida al hospital.  Prepare el bolso que llevar al hospital.  Prepare la habitacin del beb.  Concurra a todas  las visitas prenatales segn las indicaciones de su mdico.  SOLICITE ATENCIN MDICA SI:  No est segura de que est en trabajo de parto o de que ha roto la bolsa de las aguas.  Tiene mareos.  Siente clicos leves, presin en la pelvis o dolor persistente en el abdomen.  Tiene nuseas, vmitos o diarrea persistentes.  Observa una secrecin vaginal con mal olor.  Siente dolor al orinar.  SOLICITE ATENCIN MDICA DE INMEDIATO SI:  Tiene fiebre.  Tiene una prdida de lquido por la vagina.  Tiene sangrado o pequeas prdidas vaginales.  Siente dolor intenso o clicos en el abdomen.  Sube o baja de peso rpidamente.  Tiene dificultad para respirar y siente dolor de pecho.  Sbitamente se le hinchan mucho el rostro, las manos, los tobillos, los pies o las piernas.  No ha sentido los movimientos del beb durante una hora.  Siente un dolor de cabeza intenso que no se alivia con medicamentos.  Su visin se modifica.  Esta informacin no tiene como fin reemplazar el consejo del mdico. Asegrese de hacerle al mdico cualquier pregunta que tenga. Document Released: 08/09/2005 Document Revised: 11/20/2014 Document Reviewed: 12/31/2012 Elsevier Interactive Patient Education  2017 Elsevier Inc.  

## 2016-11-20 NOTE — Progress Notes (Signed)
Spanish Interpreter: Marly Adams 

## 2016-11-20 NOTE — Progress Notes (Signed)
   PRENATAL VISIT NOTE  Subjective:  Dana Wade is a 36 y.o. G3P1011 at 6545w0d being seen today for ongoing prenatal care.  She is currently monitored for the following issues for this high-risk pregnancy and has Supervision of low-risk pregnancy and Advanced maternal age in multigravida on her problem list.  Patient reports no complaints.  Contractions: Not present. Vag. Bleeding: None.  Movement: Present. Denies leaking of fluid. Reviewed 28 week labs and growth US--Nml.   The following portions of the patient's history were reviewed and updated as appropriate: allergies, current medications, past family history, past medical history, past social history, past surgical history and problem list. Problem list updated.  Objective:   Vitals:   11/20/16 0755  BP: (!) 103/59  Pulse: 78  Weight: 163 lb 6.4 oz (74.1 kg)    Fetal Status: Fetal Heart Rate (bpm): 143 Fundal Height: 31 cm Movement: Present  Presentation: Vertex  General:  Alert, oriented and cooperative. Patient is in no acute distress.  Skin: Skin is warm and dry. No rash noted.   Cardiovascular: Normal heart rate noted  Respiratory: Normal respiratory effort, no problems with respiration noted  Abdomen: Soft, gravid, appropriate for gestational age. Pain/Pressure: Absent     Pelvic:  Cervical exam deferred        Extremities: Normal range of motion.  Edema: None  Mental Status: Normal mood and affect. Normal behavior. Normal judgment and thought content.   Assessment and Plan:  Pregnancy: G3P1011 at 6245w0d  1. Encounter for supervision of low-risk pregnancy in third trimester   2. Elderly multigravida in third trimester   Term labor symptoms and general obstetric precautions including but not limited to vaginal bleeding, contractions, leaking of fluid and fetal movement were reviewed in detail with the patient. Please refer to After Visit Summary for other counseling recommendations.  Return in 2 weeks (on  12/04/2016).   Dorathy KinsmanVirginia Quintan Wade, CNM

## 2016-12-04 ENCOUNTER — Ambulatory Visit (INDEPENDENT_AMBULATORY_CARE_PROVIDER_SITE_OTHER): Payer: Self-pay | Admitting: Obstetrics & Gynecology

## 2016-12-04 DIAGNOSIS — Z3493 Encounter for supervision of normal pregnancy, unspecified, third trimester: Secondary | ICD-10-CM

## 2016-12-04 NOTE — Progress Notes (Signed)
   PRENATAL VISIT NOTE  Subjective:  Dana Wade is a 36 y.o. G3P1011 at 3728w0d being seen today for ongoing prenatal care.  She is currently monitored for the following issues for this high-risk pregnancy and has Supervision of low-risk pregnancy and Advanced maternal age in multigravida on her problem list.  Patient reports no complaints.  Contractions: Not present.  .  Movement: Present. Denies leaking of fluid.   The following portions of the patient's history were reviewed and updated as appropriate: allergies, current medications, past family history, past medical history, past social history, past surgical history and problem list. Problem list updated.  Objective:   Vitals:   12/04/16 0753  BP: (!) 85/52  Pulse: 80  Weight: 167 lb 12.8 oz (76.1 kg)    Fetal Status: Fetal Heart Rate (bpm): 159 Fundal Height: 34 cm Movement: Present     General:  Alert, oriented and cooperative. Patient is in no acute distress.  Skin: Skin is warm and dry. No rash noted.   Cardiovascular: Normal heart rate noted  Respiratory: Normal respiratory effort, no problems with respiration noted  Abdomen: Soft, gravid, appropriate for gestational age. Pain/Pressure: Absent     Pelvic:  Cervical exam deferred        Extremities: Normal range of motion.     Mental Status: Normal mood and affect. Normal behavior. Normal judgment and thought content.   Assessment and Plan:  Pregnancy: G3P1011 at 5628w0d  1. Encounter for supervision of low-risk pregnancy in third trimester Mild swelling while at work in [place] (after walking) Medical compression stocking recommended.   Preterm labor symptoms and general obstetric precautions including but not limited to vaginal bleeding, contractions, leaking of fluid and fetal movement were reviewed in detail with the patient. Please refer to After Visit Summary for other counseling recommendations.  Return in 2 weeks (on 12/18/2016).   Lesly DukesKelly H Paddy Walthall, MD

## 2016-12-18 ENCOUNTER — Ambulatory Visit (INDEPENDENT_AMBULATORY_CARE_PROVIDER_SITE_OTHER): Payer: Self-pay | Admitting: Student

## 2016-12-18 VITALS — BP 98/60 | HR 72 | Wt 169.6 lb

## 2016-12-18 DIAGNOSIS — Z3493 Encounter for supervision of normal pregnancy, unspecified, third trimester: Secondary | ICD-10-CM

## 2016-12-18 NOTE — Progress Notes (Signed)
Spanish Interpreter Laveda NormanBlanca Wade

## 2016-12-18 NOTE — Progress Notes (Signed)
   PRENATAL VISIT NOTE  Subjective:  Dana Wade is a 36 y.o. G3P1011 at 7211w0d being seen today for ongoing prenatal care.  She is currently monitored for the following issues for this low-risk pregnancy and has Supervision of low-risk pregnancy and Advanced maternal age in multigravida on her problem list.  Patient reports no complaints.  Contractions: Not present. Vag. Bleeding: None.  Movement: Present. Denies leaking of fluid.   The following portions of the patient's history were reviewed and updated as appropriate: allergies, current medications, past family history, past medical history, past social history, past surgical history and problem list. Problem list updated.  Objective:   Vitals:   12/18/16 0742  BP: 98/60  Pulse: 72  Weight: 169 lb 9.6 oz (76.9 kg)    Fetal Status: Fetal Heart Rate (bpm): 137 Fundal Height: 35 cm Movement: Present     General:  Alert, oriented and cooperative. Patient is in no acute distress.  Skin: Skin is warm and dry. No rash noted.   Cardiovascular: Normal heart rate noted  Respiratory: Normal respiratory effort, no problems with respiration noted  Abdomen: Soft, gravid, appropriate for gestational age. Pain/Pressure: Present     Pelvic:  Cervical exam deferred        Extremities: Normal range of motion.  Edema: None  Mental Status: Normal mood and affect. Normal behavior. Normal judgment and thought content.   Assessment and Plan:  Pregnancy: G3P1011 at 4811w0d  1. Encounter for supervision of low-risk pregnancy in third trimester Patient feeling well; some Braxton hicks contractions.   Term labor symptoms and general obstetric precautions including but not limited to vaginal bleeding, contractions, leaking of fluid and fetal movement were reviewed in detail with the patient. Please refer to After Visit Summary for other counseling recommendations.  Return in about 1 week (around 12/25/2016), or ROB.   Marylene LandKathryn Lorraine Loomis Anacker,  CNM

## 2016-12-18 NOTE — Patient Instructions (Signed)
Contracciones de Braxton Hicks °(Braxton Hicks Contractions) °Durante el embarazo, pueden presentarse contracciones uterinas que no siempre indican que está en trabajo de parto. °¿QUÉ SON LAS CONTRACCIONES DE BRAXTON HICKS? °Las contracciones que se presentan antes del trabajo de parto se conocen como contracciones de Braxton Hicks o falso trabajo de parto. Hacia el final del embarazo (32 a 34 semanas), estas contracciones pueden aparecen con más frecuencia y volverse más intensas. No corresponden al trabajo de parto verdadero porque estas contracciones no producen el agrandamiento (la dilatación) y el afinamiento del cuello del útero. Algunas veces, es difícil distinguirlas del trabajo de parto verdadero porque en algunos casos pueden ser muy intensas, y las personas tienen diferentes niveles de tolerancia al dolor. No debe sentirse avergonzada si concurre al hospital con falso trabajo de parto. En ocasiones, la única forma de saber si el trabajo de parto es verdadero es que el médico determine si hay cambios en el cuello del útero. °Si no hay problemas prenatales u otras complicaciones de salud asociadas con el embarazo, no habrá inconvenientes si la envían a su casa con falso trabajo de parto y espera que comience el verdadero. °CÓMO DIFERENCIAR EL TRABAJO DE PARTO FALSO DEL VERDADERO °Falso trabajo de parto  °· Las contracciones del falso trabajo de parto duran menos y no son tan intensas como las verdaderas. °· Generalmente son irregulares. °· A menudo, se sienten en la parte delantera de la parte baja del abdomen y en la ingle, °· y pueden desaparecer cuando camina o cambia de posición mientras está acostada. °· Las contracciones se vuelven más débiles y su duración es menor a medida que el tiempo transcurre. °· Por lo general, no se hacen progresivamente más intensas, regulares y cercanas entre sí como en el caso del trabajo de parto verdadero. °Verdadero trabajo de parto  °· Las contracciones del verdadero  trabajo de parto duran de 30 a 70 segundos, son muy regulares y suelen volverse más intensas, y aumenta su frecuencia. °· No desaparecen cuando camina. °· La molestia generalmente se siente en la parte superior del útero y se extiende hacia la zona inferior del abdomen y hacia la cintura. °· El médico podrá examinarla para determinar si el trabajo de parto es verdadero. El examen mostrará si el cuello del útero se está dilatando y afinando. °LO QUE DEBE RECORDAR °· Continúe haciendo los ejercicios habituales y siga otras indicaciones que el médico le dé. °· Tome todos los medicamentos como le indicó el médico. °· Concurra a las visitas prenatales regulares. °· Coma y beba con moderación si cree que está en trabajo de parto. °· Si las contracciones de Braxton Hicks le provocan incomodidad: °¨ Cambie de posición: si está acostada o descansando, camine; si está caminando, descanse. °¨ Siéntese y descanse en una bañera con agua tibia. °¨ Beba 2 o 3 vasos de agua. La deshidratación puede provocar contracciones. °¨ Respire lenta y profundamente varias veces por hora. °¿CUÁNDO DEBO BUSCAR ASISTENCIA MÉDICA INMEDIATA? °Solicite atención médica de inmediato si: °· Las contracciones se intensifican, se hacen más regulares y cercanas entre sí. °· Tiene una pérdida de líquido por la vagina. °· Tiene fiebre. °· Elimina mucosidad manchada con sangre. °· Tiene una hemorragia vaginal abundante. °· Tiene dolor abdominal permanente. °· Tiene un dolor en la zona lumbar que nunca tuvo antes. °· Siente que la cabeza del bebé empuja hacia abajo y ejerce presión en la zona pélvica. °· El bebé no se mueve tanto como solía. °Esta información no tiene como fin reemplazar el   consejo del médico. Asegúrese de hacerle al médico cualquier pregunta que tenga. °Document Released: 08/09/2005 Document Revised: 02/21/2016 Document Reviewed: 08/11/2013 °Elsevier Interactive Patient Education © 2017 Elsevier Inc. ° °

## 2016-12-26 ENCOUNTER — Encounter: Payer: Self-pay | Admitting: Student

## 2016-12-26 ENCOUNTER — Ambulatory Visit (INDEPENDENT_AMBULATORY_CARE_PROVIDER_SITE_OTHER): Payer: Self-pay | Admitting: Student

## 2016-12-26 VITALS — BP 95/62 | HR 86 | Wt 172.2 lb

## 2016-12-26 DIAGNOSIS — Z3490 Encounter for supervision of normal pregnancy, unspecified, unspecified trimester: Secondary | ICD-10-CM

## 2016-12-26 DIAGNOSIS — Z3493 Encounter for supervision of normal pregnancy, unspecified, third trimester: Secondary | ICD-10-CM

## 2016-12-26 DIAGNOSIS — Z113 Encounter for screening for infections with a predominantly sexual mode of transmission: Secondary | ICD-10-CM

## 2016-12-26 LAB — OB RESULTS CONSOLE GBS: STREP GROUP B AG: NEGATIVE

## 2016-12-26 NOTE — Progress Notes (Signed)
   PRENATAL VISIT NOTE  Subjective:  Dana Wade is a 36 y.o. G3P1011 at 7343w1d being seen today for ongoing prenatal care.  She is currently monitored for the following issues for this low-risk pregnancy and has Supervision of low-risk pregnancy and Advanced maternal age in multigravida on her problem list.  Patient reports no complaints.  Contractions: Not present. Vag. Bleeding: None.  Movement: Present. Denies leaking of fluid.   The following portions of the patient's history were reviewed and updated as appropriate: allergies, current medications, past family history, past medical history, past social history, past surgical history and problem list. Problem list updated.  Objective:   Vitals:   12/26/16 0753  BP: 95/62  Pulse: 86  Weight: 172 lb 3.2 oz (78.1 kg)    Fetal Status: Fetal Heart Rate (bpm): 133 Fundal Height: 35 cm Movement: Present  Presentation: Vertex  General:  Alert, oriented and cooperative. Patient is in no acute distress.  Skin: Skin is warm and dry. No rash noted.   Cardiovascular: Normal heart rate noted  Respiratory: Normal respiratory effort, no problems with respiration noted  Abdomen: Soft, gravid, appropriate for gestational age. Pain/Pressure: Present     Pelvic:  Cervical exam performed Dilation: 1 Effacement (%): Thick Station: -3  Extremities: Normal range of motion.  Edema: None  Mental Status: Normal mood and affect. Normal behavior. Normal judgment and thought content.   Assessment and Plan:  Pregnancy: G3P1011 at 5543w1d  1. Encounter for supervision of low-risk pregnancy, antepartum  - Culture, beta strep (group b only) - [facility]lam[facility]  Term labor symptoms and general obstetric precautions including but not limited to vaginal bleeding, contractions, leaking of fluid and fetal movement were reviewed in detail with the patient. Please refer to After Visit Summary for other counseling  recommendations.  Return in about 1 week (around 01/02/2017) for Routine OB.   Judeth HornErin Ridhi Hoffert, NP

## 2016-12-26 NOTE — Progress Notes (Signed)
Video Interpreter # [id number] 36 wk cultures today

## 2016-12-26 NOTE — Patient Instructions (Signed)

## 2016-12-27 LAB — GC/CHLAMYDIA PROBE AMP (~~LOC~~) NOT AT ARMC
Chlamydia: NEGATIVE
Neisseria Gonorrhea: NEGATIVE

## 2016-12-28 LAB — CULTURE, BETA STREP (GROUP B ONLY)

## 2016-12-30 ENCOUNTER — Encounter (HOSPITAL_COMMUNITY): Payer: Self-pay

## 2016-12-30 ENCOUNTER — Inpatient Hospital Stay (HOSPITAL_COMMUNITY)
Admission: AD | Admit: 2016-12-30 | Discharge: 2016-12-30 | Disposition: A | Discharge status: Home / Self Care | Payer: Self-pay | Source: Ambulatory Visit | Attending: Obstetrics and Gynecology | Admitting: Obstetrics and Gynecology

## 2016-12-30 DIAGNOSIS — N898 Other specified noninflammatory disorders of vagina: Secondary | ICD-10-CM | POA: Insufficient documentation

## 2016-12-30 DIAGNOSIS — O26893 Other specified pregnancy related conditions, third trimester: Secondary | ICD-10-CM | POA: Insufficient documentation

## 2016-12-30 DIAGNOSIS — Z3A00 Weeks of gestation of pregnancy not specified: Secondary | ICD-10-CM | POA: Insufficient documentation

## 2016-12-30 LAB — URINALYSIS, ROUTINE W REFLEX MICROSCOPIC
Bilirubin Urine: NEGATIVE
Glucose, UA: 50 mg/dL — AB
Hgb urine dipstick: NEGATIVE
KETONES UR: 20 mg/dL — AB
Nitrite: NEGATIVE
PROTEIN: NEGATIVE mg/dL
Specific Gravity, Urine: 1.026 (ref 1.005–1.030)
pH: 5 (ref 5.0–8.0)

## 2016-12-30 NOTE — Discharge Instructions (Signed)

## 2016-12-30 NOTE — MAU Note (Signed)
Yesterday had a thick d/c with a little bit of blood, same thing happened again today. Thick mucous. Little cramping in lower abd, some pressure. Was 1 cm on Tues

## 2017-01-02 ENCOUNTER — Ambulatory Visit (INDEPENDENT_AMBULATORY_CARE_PROVIDER_SITE_OTHER): Payer: Self-pay | Admitting: Student

## 2017-01-02 VITALS — BP 85/58 | HR 75 | Wt 171.3 lb

## 2017-01-02 DIAGNOSIS — O09523 Supervision of elderly multigravida, third trimester: Secondary | ICD-10-CM

## 2017-01-02 DIAGNOSIS — Z3493 Encounter for supervision of normal pregnancy, unspecified, third trimester: Secondary | ICD-10-CM

## 2017-01-02 NOTE — Progress Notes (Signed)
   PRENATAL VISIT NOTE  Subjective:  Dana Wade is a 36 y.o. G3P1011 at 4255w1d being seen today for ongoing prenatal care.  She is currently monitored for the following issues for this low-risk pregnancy and has Supervision of low-risk pregnancy and Advanced maternal age in multigravida on her problem list.  Patient reports no complaints.  Contractions: Irritability. Vag. Bleeding: Scant.  Movement: Present. Denies leaking of fluid.   The following portions of the patient's history were reviewed and updated as appropriate: allergies, current medications, past family history, past medical history, past social history, past surgical history and problem list. Problem list updated.  Objective:   Vitals:   01/02/17 0750  BP: (!) 85/58  Pulse: 75  Weight: 77.7 kg (171 lb 4.8 oz)    Fetal Status: Fetal Heart Rate (bpm): 152   Movement: Present     General:  Alert, oriented and cooperative. Patient is in no acute distress.  Skin: Skin is warm and dry. No rash noted.   Cardiovascular: Normal heart rate noted  Respiratory: Normal respiratory effort, no problems with respiration noted  Abdomen: Soft, gravid, appropriate for gestational age. Pain/Pressure: Present     Pelvic:  Cervical exam deferred        Extremities: Normal range of motion.  Edema: Trace  Mental Status: Normal mood and affect. Normal behavior. Normal judgment and thought content.   Assessment and Plan:  Pregnancy: G3P1011 at 6755w1d  1. Encounter for supervision of low-risk pregnancy in third trimester Reviewed when to return to the MAU.   2. Elderly multigravida in third trimester   Term labor symptoms and general obstetric precautions including but not limited to vaginal bleeding, contractions, leaking of fluid and fetal movement were reviewed in detail with the patient. Please refer to After Visit Summary for other counseling recommendations.  No Follow-up on file.   Marylene LandKathryn Lorraine Arriel Victor, CNM

## 2017-01-02 NOTE — Patient Instructions (Signed)
Contracciones de Braxton Hicks °(Braxton Hicks Contractions) °Durante el embarazo, pueden presentarse contracciones uterinas que no siempre indican que está en trabajo de parto. °¿QUÉ SON LAS CONTRACCIONES DE BRAXTON HICKS? °Las contracciones que se presentan antes del trabajo de parto se conocen como contracciones de Braxton Hicks o falso trabajo de parto. Hacia el final del embarazo (32 a 34 semanas), estas contracciones pueden aparecen con más frecuencia y volverse más intensas. No corresponden al trabajo de parto verdadero porque estas contracciones no producen el agrandamiento (la dilatación) y el afinamiento del cuello del útero. Algunas veces, es difícil distinguirlas del trabajo de parto verdadero porque en algunos casos pueden ser muy intensas, y las personas tienen diferentes niveles de tolerancia al dolor. No debe sentirse avergonzada si concurre al hospital con falso trabajo de parto. En ocasiones, la única forma de saber si el trabajo de parto es verdadero es que el médico determine si hay cambios en el cuello del útero. °Si no hay problemas prenatales u otras complicaciones de salud asociadas con el embarazo, no habrá inconvenientes si la envían a su casa con falso trabajo de parto y espera que comience el verdadero. °CÓMO DIFERENCIAR EL TRABAJO DE PARTO FALSO DEL VERDADERO °Falso trabajo de parto  °· Las contracciones del falso trabajo de parto duran menos y no son tan intensas como las verdaderas. °· Generalmente son irregulares. °· A menudo, se sienten en la parte delantera de la parte baja del abdomen y en la ingle, °· y pueden desaparecer cuando camina o cambia de posición mientras está acostada. °· Las contracciones se vuelven más débiles y su duración es menor a medida que el tiempo transcurre. °· Por lo general, no se hacen progresivamente más intensas, regulares y cercanas entre sí como en el caso del trabajo de parto verdadero. °Verdadero trabajo de parto  °· Las contracciones del verdadero  trabajo de parto duran de 30 a 70 segundos, son muy regulares y suelen volverse más intensas, y aumenta su frecuencia. °· No desaparecen cuando camina. °· La molestia generalmente se siente en la parte superior del útero y se extiende hacia la zona inferior del abdomen y hacia la cintura. °· El médico podrá examinarla para determinar si el trabajo de parto es verdadero. El examen mostrará si el cuello del útero se está dilatando y afinando. °LO QUE DEBE RECORDAR °· Continúe haciendo los ejercicios habituales y siga otras indicaciones que el médico le dé. °· Tome todos los medicamentos como le indicó el médico. °· Concurra a las visitas prenatales regulares. °· Coma y beba con moderación si cree que está en trabajo de parto. °· Si las contracciones de Braxton Hicks le provocan incomodidad: °¨ Cambie de posición: si está acostada o descansando, camine; si está caminando, descanse. °¨ Siéntese y descanse en una bañera con agua tibia. °¨ Beba 2 o 3 vasos de agua. La deshidratación puede provocar contracciones. °¨ Respire lenta y profundamente varias veces por hora. °¿CUÁNDO DEBO BUSCAR ASISTENCIA MÉDICA INMEDIATA? °Solicite atención médica de inmediato si: °· Las contracciones se intensifican, se hacen más regulares y cercanas entre sí. °· Tiene una pérdida de líquido por la vagina. °· Tiene fiebre. °· Elimina mucosidad manchada con sangre. °· Tiene una hemorragia vaginal abundante. °· Tiene dolor abdominal permanente. °· Tiene un dolor en la zona lumbar que nunca tuvo antes. °· Siente que la cabeza del bebé empuja hacia abajo y ejerce presión en la zona pélvica. °· El bebé no se mueve tanto como solía. °Esta información no tiene como fin reemplazar el   consejo del médico. Asegúrese de hacerle al médico cualquier pregunta que tenga. °Document Released: 08/09/2005 Document Revised: 02/21/2016 Document Reviewed: 08/11/2013 °Elsevier Interactive Patient Education © 2017 Elsevier Inc. ° °

## 2017-01-09 ENCOUNTER — Ambulatory Visit (INDEPENDENT_AMBULATORY_CARE_PROVIDER_SITE_OTHER): Payer: Self-pay | Admitting: Student

## 2017-01-09 VITALS — BP 100/60 | HR 78 | Wt 172.1 lb

## 2017-01-09 DIAGNOSIS — Z758 Other problems related to medical facilities and other health care: Secondary | ICD-10-CM | POA: Insufficient documentation

## 2017-01-09 DIAGNOSIS — Z3493 Encounter for supervision of normal pregnancy, unspecified, third trimester: Secondary | ICD-10-CM

## 2017-01-09 DIAGNOSIS — Z789 Other specified health status: Secondary | ICD-10-CM

## 2017-01-09 NOTE — Progress Notes (Signed)
Used [employer]Williamsstratus interpreter [employer]drianna [id number].

## 2017-01-09 NOTE — Progress Notes (Signed)
   PRENATAL VISIT NOTE  Subjective:  Dana Wade is a 36 y.o. G3P1011 at 4631w1d being seen today for ongoing prenatal care.  She is currently monitored for the following issues for this low-risk pregnancy and has Supervision of low-risk pregnancy and Advanced maternal age in multigravida on her problem list.  Patient reports no complaints.  Contractions: Not present. Vag. Bleeding: None.  Movement: Present. Denies leaking of fluid.   The following portions of the patient's history were reviewed and updated as appropriate: allergies, current medications, past family history, past medical history, past social history, past surgical history and problem list. Problem list updated.  Objective:   Vitals:   01/09/17 0800  BP: 100/60  Pulse: 78  Weight: 172 lb 1.6 oz (78.1 kg)    Fetal Status: Fetal Heart Rate (bpm): 145 Fundal Height: 38 cm Movement: Present  Presentation: Vertex  General:  Alert, oriented and cooperative. Patient is in no acute distress.  Skin: Skin is warm and dry. No rash noted.   Cardiovascular: Normal heart rate noted  Respiratory: Normal respiratory effort, no problems with respiration noted  Abdomen: Soft, gravid, appropriate for gestational age. Pain/Pressure: Present     Pelvic:  Cervical exam performed Dilation: 1 Effacement (%): Thick Station: -3  Extremities: Normal range of motion.  Edema: Trace  Mental Status: Normal mood and affect. Normal behavior. Normal judgment and thought content.   Assessment and Plan:  Pregnancy: G3P1011 at 8931w1d  1. Encounter for supervision of low-risk pregnancy in third trimester   2. Language barrier -video Spanish interpreter used for visit    Term labor symptoms and general obstetric precautions including but not limited to vaginal bleeding, contractions, leaking of fluid and fetal movement were reviewed in detail with the patient. Please refer to After Visit Summary for other counseling recommendations.    Return in about 1 week (around 01/16/2017) for Routine OB.   Judeth HornErin Kaylea Mounsey, NP

## 2017-01-09 NOTE — Patient Instructions (Signed)
Contracciones de Braxton Hicks °(Braxton Hicks Contractions) °Durante el embarazo, pueden presentarse contracciones uterinas que no siempre indican que está en trabajo de parto. °¿QUÉ SON LAS CONTRACCIONES DE BRAXTON HICKS? °Las contracciones que se presentan antes del trabajo de parto se conocen como contracciones de Braxton Hicks o falso trabajo de parto. Hacia el final del embarazo (32 a 34 semanas), estas contracciones pueden aparecen con más frecuencia y volverse más intensas. No corresponden al trabajo de parto verdadero porque estas contracciones no producen el agrandamiento (la dilatación) y el afinamiento del cuello del útero. Algunas veces, es difícil distinguirlas del trabajo de parto verdadero porque en algunos casos pueden ser muy intensas, y las personas tienen diferentes niveles de tolerancia al dolor. No debe sentirse avergonzada si concurre al hospital con falso trabajo de parto. En ocasiones, la única forma de saber si el trabajo de parto es verdadero es que el médico determine si hay cambios en el cuello del útero. °Si no hay problemas prenatales u otras complicaciones de salud asociadas con el embarazo, no habrá inconvenientes si la envían a su casa con falso trabajo de parto y espera que comience el verdadero. °CÓMO DIFERENCIAR EL TRABAJO DE PARTO FALSO DEL VERDADERO °Falso trabajo de parto  °· Las contracciones del falso trabajo de parto duran menos y no son tan intensas como las verdaderas. °· Generalmente son irregulares. °· A menudo, se sienten en la parte delantera de la parte baja del abdomen y en la ingle, °· y pueden desaparecer cuando camina o cambia de posición mientras está acostada. °· Las contracciones se vuelven más débiles y su duración es menor a medida que el tiempo transcurre. °· Por lo general, no se hacen progresivamente más intensas, regulares y cercanas entre sí como en el caso del trabajo de parto verdadero. °Verdadero trabajo de parto  °· Las contracciones del verdadero  trabajo de parto duran de 30 a 70 segundos, son muy regulares y suelen volverse más intensas, y aumenta su frecuencia. °· No desaparecen cuando camina. °· La molestia generalmente se siente en la parte superior del útero y se extiende hacia la zona inferior del abdomen y hacia la cintura. °· El médico podrá examinarla para determinar si el trabajo de parto es verdadero. El examen mostrará si el cuello del útero se está dilatando y afinando. °LO QUE DEBE RECORDAR °· Continúe haciendo los ejercicios habituales y siga otras indicaciones que el médico le dé. °· Tome todos los medicamentos como le indicó el médico. °· Concurra a las visitas prenatales regulares. °· Coma y beba con moderación si cree que está en trabajo de parto. °· Si las contracciones de Braxton Hicks le provocan incomodidad: °¨ Cambie de posición: si está acostada o descansando, camine; si está caminando, descanse. °¨ Siéntese y descanse en una bañera con agua tibia. °¨ Beba 2 o 3 vasos de agua. La deshidratación puede provocar contracciones. °¨ Respire lenta y profundamente varias veces por hora. °¿CUÁNDO DEBO BUSCAR ASISTENCIA MÉDICA INMEDIATA? °Solicite atención médica de inmediato si: °· Las contracciones se intensifican, se hacen más regulares y cercanas entre sí. °· Tiene una pérdida de líquido por la vagina. °· Tiene fiebre. °· Elimina mucosidad manchada con sangre. °· Tiene una hemorragia vaginal abundante. °· Tiene dolor abdominal permanente. °· Tiene un dolor en la zona lumbar que nunca tuvo antes. °· Siente que la cabeza del bebé empuja hacia abajo y ejerce presión en la zona pélvica. °· El bebé no se mueve tanto como solía. °Esta información no tiene como fin reemplazar el   consejo del médico. Asegúrese de hacerle al médico cualquier pregunta que tenga. °Document Released: 08/09/2005 Document Revised: 02/21/2016 Document Reviewed: 08/11/2013 °Elsevier Interactive Patient Education © 2017 Elsevier Inc. ° °

## 2017-01-16 ENCOUNTER — Ambulatory Visit (INDEPENDENT_AMBULATORY_CARE_PROVIDER_SITE_OTHER): Payer: Self-pay | Admitting: Family

## 2017-01-16 VITALS — BP 97/64 | HR 79 | Wt 173.5 lb

## 2017-01-16 DIAGNOSIS — Z3493 Encounter for supervision of normal pregnancy, unspecified, third trimester: Secondary | ICD-10-CM

## 2017-01-16 DIAGNOSIS — O09523 Supervision of elderly multigravida, third trimester: Secondary | ICD-10-CM

## 2017-01-16 DIAGNOSIS — Z789 Other specified health status: Secondary | ICD-10-CM

## 2017-01-16 NOTE — Progress Notes (Signed)
   PRENATAL VISIT NOTE  Subjective:  Dana Wade is a 36 y.o. G3P1011 at 7384w1d being seen today for ongoing prenatal care.  She is currently monitored for the following issues for this low-risk pregnancy and has Supervision of low-risk pregnancy; Advanced maternal age in multigravida; and Language barrier on her problem list.  Patient reports no complaints.  Contractions: Not present. Vag. Bleeding: None.  Movement: Present. Denies leaking of fluid.   The following portions of the patient's history were reviewed and updated as appropriate: allergies, current medications, past family history, past medical history, past social history, past surgical history and problem list. Problem list updated.  Objective:   Vitals:   01/16/17 0853  BP: 97/64  Pulse: 79  Weight: 173 lb 8 oz (78.7 kg)    Fetal Status: Fetal Heart Rate (bpm): 147 Fundal Height: 38 cm Movement: Present  Presentation: Vertex  General:  Alert, oriented and cooperative. Patient is in no acute distress.  Skin: Skin is warm and dry. No rash noted.   Cardiovascular: Normal heart rate noted  Respiratory: Normal respiratory effort, no problems with respiration noted  Abdomen: Soft, gravid, appropriate for gestational age. Pain/Pressure: Present     Pelvic:  Cervical exam deferred        Extremities: Normal range of motion.  Edema: Trace  Mental Status: Normal mood and affect. Normal behavior. Normal judgment and thought content.   Assessment and Plan:  Pregnancy: G3P1011 at 4584w1d  1. Encounter for supervision of low-risk pregnancy in third trimester - Discussed NST at next visit - Reviewed IOL at 41 weeks  2. Elderly multigravida in third trimester - Declined NIPS  3. Language barrier - Utilized video interpreter  Term labor symptoms and general obstetric precautions including but not limited to vaginal bleeding, contractions, leaking of fluid and fetal movement were reviewed in detail with the  patient. Please refer to After Visit Summary for other counseling recommendations.  Return in about 1 week (around 01/23/2017) for appt and NST.   Eino FarberWalidah Kennith GainN Karim, CNM

## 2017-01-16 NOTE — Progress Notes (Signed)
All information obtained and conveyed via video interpreter [phone]

## 2017-01-22 ENCOUNTER — Telehealth: Payer: Self-pay | Admitting: *Deleted

## 2017-01-22 NOTE — Telephone Encounter (Signed)
[facility] [place] # [phone] and informed her that the office will be closed tomorrow until 10 am due to the snow and will not be able to see her @ 0840 as scheduled. Pt confirms good FM daily.  She will be called with a new appt for 3/14 or 3/15.  Pt voiced understanding and agreed.

## 2017-01-23 ENCOUNTER — Other Ambulatory Visit: Payer: Self-pay | Admitting: Student

## 2017-01-24 ENCOUNTER — Ambulatory Visit: Payer: Self-pay

## 2017-01-24 ENCOUNTER — Ambulatory Visit (INDEPENDENT_AMBULATORY_CARE_PROVIDER_SITE_OTHER): Payer: Self-pay | Admitting: Family Medicine

## 2017-01-24 VITALS — BP 108/66 | HR 84 | Wt 172.7 lb

## 2017-01-24 DIAGNOSIS — Z3689 Encounter for other specified antenatal screening: Secondary | ICD-10-CM | POA: Diagnosis not present

## 2017-01-24 DIAGNOSIS — Z3493 Encounter for supervision of normal pregnancy, unspecified, third trimester: Secondary | ICD-10-CM

## 2017-01-24 DIAGNOSIS — Z3483 Encounter for supervision of other normal pregnancy, third trimester: Secondary | ICD-10-CM | POA: Diagnosis not present

## 2017-01-24 DIAGNOSIS — O480 Post-term pregnancy: Secondary | ICD-10-CM

## 2017-01-24 NOTE — Progress Notes (Signed)
   PRENATAL VISIT NOTE  Subjective:  Dana Wade is a 36 y.o. G3P1011 at 4055w2d being seen today for ongoing prenatal care.  She is currently monitored for the following issues for this low-risk pregnancy and has Supervision of low-risk pregnancy; Advanced maternal age in multigravida; and Language barrier on her problem list.  Patient reports no complaints.  Contractions: Irregular. Vag. Bleeding: None.  Movement: Present. Denies leaking of fluid.   The following portions of the patient's history were reviewed and updated as appropriate: allergies, current medications, past family history, past medical history, past social history, past surgical history and problem list. Problem list updated.  Objective:   Vitals:   01/24/17 1404  BP: 108/66  Pulse: 84  Weight: 172 lb 11.2 oz (78.3 kg)    Fetal Status: Fetal Heart Rate (bpm): NST   Movement: Present  Presentation: Vertex  General:  Alert, oriented and cooperative. Patient is in no acute distress.  Skin: Skin is warm and dry. No rash noted.   Cardiovascular: Normal heart rate noted  Respiratory: Normal respiratory effort, no problems with respiration noted  Abdomen: Soft, gravid, appropriate for gestational age. Pain/Pressure: Present     Pelvic:  Cervical exam deferred        Extremities: Normal range of motion.  Edema: Trace  Mental Status: Normal mood and affect. Normal behavior. Normal judgment and thought content.  NST reviewed and reactive.  Assessment and Plan:  Pregnancy: G3P1011 at 1755w2d  1. Post term pregnancy, antepartum condition or complication Patient scheduled for IOL at 40 6/7 wks, due to no availability at 41 wks. - Fetal nonstress test - US OB Limited; Future  2. Encounter for supervision of low-risk pregnancy in third trimester Continue routine prenatal care.   Term labor symptoms and general obstetric precautions including but not limited to vaginal bleeding, contractions, leaking of fluid and  fetal movement were reviewed in detail with the patient. Please refer to After Visit Summary for other counseling recommendations.  Return in about 6 weeks (around 03/07/2017) for PP visit.  IOL on 3/18, pp check.   Reva Boresanya S Aseel Uhde, MD

## 2017-01-24 NOTE — Progress Notes (Signed)
Pt informed that the ultrasound is considered a limited OB ultrasound and is not intended to be a complete ultrasound exam.  Patient also informed that the ultrasound is not being completed with the intent of assessing for fetal or placental anomalies or any pelvic abnormalities.  Explained that the purpose of today's ultrasound is to assess for presentation and amniotic fluid volume.  Patient acknowledges the purpose of the exam and the limitations of the study.    IOL scheduled 3/18 @ midnight - approved by Dr. Shawnie PonsPratt.

## 2017-01-24 NOTE — Patient Instructions (Signed)
Tercer trimestre de embarazo (Third Trimester of Pregnancy) El tercer trimestre comprende desde la semana29 hasta la semana42, es decir, desde el mes7 hasta el mes9. El tercer trimestre es un perodo en el que el feto crece rpidamente. Hacia el final del noveno mes, el feto mide alrededor de 20pulgadas (45cm) de largo y pesa entre 6 y 10 libras (2,700 y 4,500kg). CAMBIOS EN EL ORGANISMO Su organismo atraviesa por muchos cambios durante el embarazo, y estos varan de una mujer a otra.  Seguir aumentando de peso. Es de esperar que aumente entre 25 y 35libras (11 y 16kg) hacia el final del embarazo.  Podrn aparecer las primeras estras en las caderas, el abdomen y las mamas.  Puede tener necesidad de orinar con ms frecuencia porque el feto baja hacia la pelvis y ejerce presin sobre la vejiga.  Debido al embarazo podr sentir acidez estomacal con frecuencia.  Puede estar estreida, ya que ciertas hormonas enlentecen los movimientos de los msculos que empujan los desechos a travs de los intestinos.  Pueden aparecer hemorroides o abultarse e hincharse las venas (venas varicosas).  Puede sentir dolor plvico debido al aumento de peso y a que las hormonas del embarazo relajan las articulaciones entre los huesos de la pelvis. El dolor de espalda puede ser consecuencia de la sobrecarga de los msculos que soportan la postura.  Tal vez haya cambios en el cabello que pueden incluir su engrosamiento, crecimiento rpido y cambios en la textura. Adems, a algunas mujeres se les cae el cabello durante o despus del embarazo, o tienen el cabello seco o fino. Lo ms probable es que el cabello se le normalice despus del nacimiento del beb.  Las mamas seguirn creciendo y le dolern. A veces, puede haber una secrecin amarilla de las mamas llamada calostro.  El ombligo puede salir hacia afuera.  Puede sentir que le falta el aire debido a que se expande el tero.  Puede notar que el feto  "baja" o lo siente ms bajo, en el abdomen.  Puede tener una prdida de secrecin mucosa con sangre. Esto suele ocurrir en el trmino de unos pocos das a una semana antes de que comience el trabajo de parto.  El cuello del tero se vuelve delgado y blando (se borra) cerca de la fecha de parto. QU DEBE ESPERAR EN LOS EXMENES PRENATALES Le harn exmenes prenatales cada 2semanas hasta la semana36. A partir de ese momento le harn exmenes semanales. Durante una visita prenatal de rutina:  La pesarn para asegurarse de que usted y el feto estn creciendo normalmente.  Le tomarn la presin arterial.  Le medirn el abdomen para controlar el desarrollo del beb.  Se escucharn los latidos cardacos fetales.  Se evaluarn los resultados de los estudios solicitados en visitas anteriores.  Le revisarn el cuello del tero cuando est prxima la fecha de parto para controlar si este se ha borrado. Alrededor de la semana36, el mdico le revisar el cuello del tero. Al mismo tiempo, realizar un anlisis de las secreciones del tejido vaginal. Este examen es para determinar si hay un tipo de bacteria, estreptococo Grupo B. El mdico le explicar esto con ms detalle. El mdico puede preguntarle lo siguiente:  Cmo le gustara que fuera el parto.  Cmo se siente.  Si siente los movimientos del beb.  Si ha tenido sntomas anormales, como prdida de lquido, sangrado, dolores de cabeza intensos o clicos abdominales.  Si est consumiendo algn producto que contenga tabaco, como cigarrillos, tabaco de mascar y   cigarrillos electrnicos.  Si tiene alguna pregunta. Otros exmenes o estudios de deteccin que pueden realizarse durante el tercer trimestre incluyen lo siguiente:  Anlisis de sangre para controlar los niveles de hierro (anemia).  Controles fetales para determinar su salud, nivel de actividad y crecimiento. Si tiene alguna enfermedad o hay problemas durante el embarazo, le harn  estudios.  Prueba del VIH (virus de inmunodeficiencia humana). Si corre un riesgo alto, pueden realizarle una prueba de deteccin del VIH durante el tercer trimestre del embarazo. FALSO TRABAJO DE PARTO Es posible que sienta contracciones leves e irregulares que finalmente desaparecen. Se llaman contracciones de Braxton Hicks o falso trabajo de parto. Las contracciones pueden durar horas, das o incluso semanas, antes de que el verdadero trabajo de parto se inicie. Si las contracciones ocurren a intervalos regulares, se intensifican o se hacen dolorosas, lo mejor es que la revise el mdico. SIGNOS DE TRABAJO DE PARTO  Clicos de tipo menstrual.  Contracciones cada 5minutos o menos.  Contracciones que comienzan en la parte superior del tero y se extienden hacia abajo, a la zona inferior del abdomen y la espalda.  Sensacin de mayor presin en la pelvis o dolor de espalda.  Una secrecin de mucosidad acuosa o con sangre que sale de la vagina. Si tiene alguno de estos signos antes de la semana37 del embarazo, llame a su mdico de inmediato. Debe concurrir al hospital para que la controlen inmediatamente. INSTRUCCIONES PARA EL CUIDADO EN EL HOGAR  Evite fumar, consumir hierbas, beber alcohol y tomar frmacos que no le hayan recetado. Estas sustancias qumicas afectan la formacin y el desarrollo del beb.  No consuma ningn producto que contenga tabaco, lo que incluye cigarrillos, tabaco de mascar y cigarrillos electrnicos. Si necesita ayuda para dejar de fumar, consulte al mdico. Puede recibir asesoramiento y otro tipo de recursos para dejar de fumar.  Siga las indicaciones del mdico en relacin con el uso de medicamentos. Durante el embarazo, hay medicamentos que son seguros de tomar y otros que no.  Haga ejercicio solamente como se lo haya indicado el mdico. Sentir clicos uterinos es un buen signo para detener la actividad fsica.  Contine comiendo alimentos sanos con  regularidad.  Use un sostn que le brinde buen soporte si le duelen las mamas.  No se d baos de inmersin en agua caliente, baos turcos ni saunas.  Use el cinturn de seguridad en todo momento mientras conduce.  No coma carne cruda ni queso sin cocinar; evite el contacto con las bandejas sanitarias de los gatos y la tierra que estos animales usan. Estos elementos contienen grmenes que pueden causar defectos congnitos en el beb.  Tome las vitaminas prenatales.  Tome entre 1500 y 2000mg de calcio diariamente comenzando en la semana20 del embarazo hasta el parto.  Si est estreida, pruebe un laxante suave (si el mdico lo autoriza). Consuma ms alimentos ricos en fibra, como vegetales y frutas frescos y cereales integrales. Beba gran cantidad de lquido para mantener la orina de tono claro o color amarillo plido.  Dese baos de asiento con agua tibia para aliviar el dolor o las molestias causadas por las hemorroides. Use una crema para las hemorroides si el mdico la autoriza.  Si tiene venas varicosas, use medias de descanso. Eleve los pies durante 15minutos, 3 o 4veces por da. Limite el consumo de sal en su dieta.  Evite levantar objetos pesados, use zapatos de tacones bajos y mantenga una buena postura.  Descanse con las piernas elevadas si tiene   calambres o dolor de cintura.  Visite a su dentista si no lo ha hecho durante el embarazo. Use un cepillo de dientes blando para higienizarse los dientes y psese el hilo dental con suavidad.  Puede seguir manteniendo relaciones sexuales, a menos que el mdico le indique lo contrario.  No haga viajes largos excepto que sea absolutamente necesario y solo con la autorizacin del mdico.  Tome clases prenatales para entender, practicar y hacer preguntas sobre el trabajo de parto y el parto.  Haga un ensayo de la partida al hospital.  Prepare el bolso que llevar al hospital.  Prepare la habitacin del beb.  Concurra a todas  las visitas prenatales segn las indicaciones de su mdico.  SOLICITE ATENCIN MDICA SI:  No est segura de que est en trabajo de parto o de que ha roto la bolsa de las aguas.  Tiene mareos.  Siente clicos leves, presin en la pelvis o dolor persistente en el abdomen.  Tiene nuseas, vmitos o diarrea persistentes.  Observa una secrecin vaginal con mal olor.  Siente dolor al orinar.  SOLICITE ATENCIN MDICA DE INMEDIATO SI:  Tiene fiebre.  Tiene una prdida de lquido por la vagina.  Tiene sangrado o pequeas prdidas vaginales.  Siente dolor intenso o clicos en el abdomen.  Sube o baja de peso rpidamente.  Tiene dificultad para respirar y siente dolor de pecho.  Sbitamente se le hinchan mucho el rostro, las manos, los tobillos, los pies o las piernas.  No ha sentido los movimientos del beb durante una hora.  Siente un dolor de cabeza intenso que no se alivia con medicamentos.  Su visin se modifica.  Esta informacin no tiene como fin reemplazar el consejo del mdico. Asegrese de hacerle al mdico cualquier pregunta que tenga. Document Released: 08/09/2005 Document Revised: 11/20/2014 Document Reviewed: 12/31/2012 Elsevier Interactive Patient Education  2017 [employer]   Lactancia materna (Breastfeeding) Decidir amamantar es una de las mejores elecciones que puede hacer por usted y su beb. El cambio hormonal durante el embarazo produce el desarrollo del tejido mamario y aumenta la cantidad y el tamao de los conductos galactforos. Estas hormonas tambin permiten que las protenas, los azcares y las grasas de la sangre produzcan la leche materna en las glndulas productoras de leche. Las hormonas impiden que la leche materna sea liberada antes del nacimiento del beb, adems de impulsar el flujo de leche luego del nacimiento. Una vez que ha comenzado a amamantar, pensar en el beb, as como la succin o el llanto, pueden estimular la liberacin de leche  de las glndulas productoras de leche. LOS BENEFICIOS DE AMAMANTAR Para el beb  La primera leche (calostro) ayuda a mejorar el funcionamiento del sistema digestivo del beb.  La leche tiene anticuerpos que ayudan a prevenir las infecciones en el beb.  El beb tiene una menor incidencia de asma, alergias y del sndrome de muerte sbita del lactante.  Los nutrientes en la leche materna son mejores para el beb que la leche maternizada y estn preparados exclusivamente para cubrir las necesidades del beb.  La leche materna mejora el desarrollo cerebral del beb.  Es menos probable que el beb desarrolle otras enfermedades, como obesidad infantil, asma o diabetes mellitus de tipo 2. Para usted  La lactancia materna favorece el desarrollo de un vnculo muy especial entre la madre y el beb.  Es conveniente. La leche materna siempre est disponible a la temperatura correcta y es econmica.  La lactancia materna ayuda a quemar caloras y   a perder el peso ganado durante el embarazo.  Favorece la contraccin del tero al tamao que tena antes del embarazo de manera ms rpida y disminuye el sangrado (loquios) despus del parto.  La lactancia materna contribuye a reducir el riesgo de desarrollar diabetes mellitus de tipo 2, osteoporosis o cncer de mama o de ovario en el futuro. SIGNOS DE QUE EL BEB EST HAMBRIENTO Primeros signos de hambre  Aumenta su estado de alerta o actividad.  Se estira.  Mueve la cabeza de un lado a otro.  Mueve la cabeza y abre la boca cuando se le toca la mejilla o la comisura de la boca (reflejo de bsqueda).  Aumenta las vocalizaciones, tales como sonidos de succin, se relame los labios, emite arrullos, suspiros, o chirridos.  Mueve la mano hacia la boca.  Se chupa con ganas los dedos o las manos. Signos tardos de hambre  Est agitado.  Llora de manera intermitente. Signos de hambre extrema Los signos de hambre extrema requerirn que lo calme y  lo consuele antes de que el beb pueda alimentarse adecuadamente. No espere a que se manifiesten los siguientes signos de hambre extrema para comenzar a amamantar:  Agitacin.  Llanto intenso y fuerte.  Gritos. INFORMACIN BSICA SOBRE LA LACTANCIA MATERNA Iniciacin de la lactancia materna  Encuentre un lugar cmodo para sentarse o acostarse, con un buen respaldo para el cuello y la espalda.  Coloque una almohada o una manta enrollada debajo del beb para acomodarlo a la altura de la mama (si est sentada). Las almohadas para amamantar se han diseado especialmente a fin de servir de apoyo para los brazos y el beb mientras amamanta.  Asegrese de que el abdomen del beb est frente al suyo.  Masajee suavemente la mama. Con las yemas de los dedos, masajee la pared del pecho hacia el pezn en un movimiento circular. Esto estimula el flujo de leche. Es posible que deba continuar este movimiento mientras amamanta si la leche fluye lentamente.  Sostenga la mama con el pulgar por arriba del pezn y los otros 4 dedos por debajo de la mama. Asegrese de que los dedos se encuentren lejos del pezn y de la boca del beb.  Empuje suavemente los labios del beb con el pezn o con el dedo.  Cuando la boca del beb se abra lo suficiente, acrquelo rpidamente a la mama e introduzca todo el pezn y la zona oscura que lo rodea (areola), tanto como sea posible, dentro de la boca del beb. ? Debe haber ms areola visible por arriba del labio superior del beb que por debajo del labio inferior. ? La lengua del beb debe estar entre la enca inferior y la mama.  Asegrese de que la boca del beb est en la posicin correcta alrededor del pezn (prendida). Los labios del beb deben crear un sello sobre la mama y estar doblados hacia afuera (invertidos).  Es comn que el beb succione durante 2 a 3 minutos para que comience el flujo de leche materna. Cmo debe prenderse Es muy importante que le ensee al  beb cmo prenderse adecuadamente a la mama. Si el beb no se prende adecuadamente, puede causarle dolor en el pezn y reducir la produccin de leche materna, y hacer que el beb tenga un escaso aumento de peso. Adems, si el beb no se prende adecuadamente al pezn, puede tragar aire durante la alimentacin. Esto puede causarle molestias al beb. Hacer eructar al beb al cambiar de mama puede ayudarlo a liberar el   aire. Sin embargo, ensearle al beb cmo prenderse a la mama adecuadamente es la mejor manera de evitar que se sienta molesto por tragar aire mientras se alimenta. Signos de que el beb se ha prendido adecuadamente al pezn:  Tironea o succiona de modo silencioso, sin causarle dolor.  Se escucha que traga cada 3 o 4 succiones.  Hay movimientos musculares por arriba y por delante de sus odos al succionar. Signos de que el beb no se ha prendido adecuadamente al pezn:  Hace ruidos de succin o de chasquido mientras se alimenta.  Siente dolor en el pezn. Si cree que el beb no se prendi correctamente, deslice el dedo en la comisura de la boca y colquelo entre las encas del beb para interrumpir la succin. Intente comenzar a amamantar nuevamente. Signos de lactancia materna exitosa Signos del beb:  Disminuye gradualmente el nmero de succiones o cesa la succin por completo.  Se duerme.  Relaja el cuerpo.  Retiene una pequea cantidad de leche en la boca.  Se desprende solo del pecho. Signos que presenta usted:  Las mamas han aumentado la firmeza, el peso y el tamao 1 a 3 horas despus de amamantar.  Estn ms blandas inmediatamente despus de amamantar.  Un aumento del volumen de leche, y tambin un cambio en su consistencia y color se producen hacia el quinto da de lactancia materna.  Los pezones no duelen, ni estn agrietados ni sangran. Signos de que su beb recibe la cantidad de leche suficiente  Mojar por lo menos 1 o 2 paales durante las primeras 24  horas despus del nacimiento.  Mojar por lo menos 5 o 6 paales cada 24 horas durante la primera semana despus del nacimiento. La orina debe ser transparente o de color amarillo plido a los 5 das despus del nacimiento.  Mojar entre 6 y 8 paales cada 24 horas a medida que el beb sigue creciendo y desarrollndose.  Defeca al menos 3 veces en 24 horas a los 5 das de vida. La materia fecal debe ser blanda y amarillenta.  Defeca al menos 3 veces en 24 horas a los 7 das de vida. La materia fecal debe ser grumosa y amarillenta.  No registra una prdida de peso mayor del 10% del peso al nacer durante los primeros 3 das de vida.  Aumenta de peso un promedio de 4 a 7onzas (113 a 198g) por semana despus de los 4 das de vida.  Aumenta de peso, diariamente, de manera uniforme a partir de los 5 das de vida, sin registrar prdida de peso despus de las 2semanas de vida. Despus de alimentarse, es posible que el beb regurgite una pequea cantidad. Esto es frecuente. FRECUENCIA Y DURACIN DE LA LACTANCIA MATERNA El amamantamiento frecuente la ayudar a producir ms leche y a prevenir problemas de dolor en los pezones e hinchazn en las mamas. Alimente al beb cuando muestre signos de hambre o si siente la necesidad de reducir la congestin de las mamas. Esto se denomina "lactancia a demanda". Evite el uso del chupete mientras trabaja para establecer la lactancia (las primeras 4 a 6 semanas despus del nacimiento del beb). Despus de este perodo, podr ofrecerle un chupete. Las investigaciones demostraron que el uso del chupete durante el primer ao de vida del beb disminuye el riesgo de desarrollar el sndrome de muerte sbita del lactante (SMSL). Permita que el nio se alimente en cada mama todo lo que desee. Contine amamantando al beb hasta que haya terminado de alimentarse. Cuando   el beb se desprende o se queda dormido mientras se est alimentando de la primera mama, ofrzcale la segunda.  Debido a que, con frecuencia, los recin nacidos permanecen somnolientos las primeras semanas de vida, es posible que deba despertar al beb para alimentarlo. Los horarios de lactancia varan de un beb a otro. Sin embargo, las siguientes reglas pueden servir como gua para ayudarla a garantizar que el beb se alimenta adecuadamente:  Se puede amamantar a los recin nacidos (bebs de 4 semanas o menos de vida) cada 1 a 3 horas.  No deben transcurrir ms de 3 horas durante el da o 5 horas durante la noche sin que se amamante a los recin nacidos.  Debe amamantar al beb 8 veces como mnimo en un perodo de 24 horas, hasta que comience a introducir slidos en su dieta, a los 6 meses de vida aproximadamente. EXTRACCIN DE LECHE MATERNA La extraccin y el almacenamiento de la leche materna le permiten asegurarse de que el beb se alimente exclusivamente de leche materna, aun en momentos en los que no puede amamantar. Esto tiene especial importancia si debe regresar al trabajo en el perodo en que an est amamantando o si no puede estar presente en los momentos en que el beb debe alimentarse. Su asesor en lactancia puede orientarla sobre cunto tiempo es seguro almacenar leche materna. El sacaleche es un aparato que le permite extraer leche de la mama a un recipiente estril. Luego, la leche materna extrada puede almacenarse en un refrigerador o congelador. Algunos sacaleches son manuales, mientras que otros son elctricos. Consulte a su asesor en lactancia qu tipo ser ms conveniente para usted. Los sacaleches se pueden comprar; sin embargo, algunos hospitales y grupos de apoyo a la lactancia materna alquilan sacaleches mensualmente. Un asesor en lactancia puede ensearle cmo extraer leche materna manualmente, en caso de que prefiera no usar un sacaleche. CMO CUIDAR LAS MAMAS DURANTE LA LACTANCIA MATERNA Los pezones se secan, agrietan y duelen durante la lactancia materna. Las siguientes  recomendaciones pueden ayudarla a mantener las mamas humectadas y sanas:  Evite usar jabn en los pezones.  Use un sostn de soporte. Aunque no son esenciales, las camisetas sin mangas o los sostenes especiales para amamantar estn diseados para acceder fcilmente a las mamas, para amamantar sin tener que quitarse todo el sostn o la camiseta. Evite usar sostenes con aro o sostenes muy ajustados.  Seque al aire sus pezones durante 3 a 4minutos despus de amamantar al beb.  Utilice solo apsitos de algodn en el sostn para absorber las prdidas de leche. La prdida de un poco de leche materna entre las tomas es normal.  Utilice lanolina sobre los pezones luego de amamantar. La lanolina ayuda a mantener la humedad normal de la piel. Si usa lanolina pura, no tiene que lavarse los pezones antes de volver a alimentar al beb. La lanolina pura no es txica para el beb. Adems, puede extraer manualmente algunas gotas de leche materna y masajear suavemente esa leche sobre los pezones, para que la leche se seque al aire. Durante las primeras semanas despus de dar a luz, algunas mujeres pueden experimentar hinchazn en las mamas (congestin mamaria). La congestin puede hacer que sienta las mamas pesadas, calientes y sensibles al tacto. El pico de la congestin ocurre dentro de los 3 a 5 das despus del parto. Las siguientes recomendaciones pueden ayudarla a aliviar la congestin:  Vace por completo las mamas al amamantar o extraer leche. Puede aplicar calor hmedo en las mamas (  en la ducha o con toallas hmedas para manos) antes de amamantar o extraer leche. Esto aumenta la circulacin y ayuda a que la leche fluya. Si el beb no vaca por completo las mamas cuando lo amamanta, extraiga la leche restante despus de que haya finalizado.  Use un sostn ajustado (para amamantar o comn) o una camiseta sin mangas durante 1 o 2 das para indicar al cuerpo que disminuya ligeramente la produccin de  leche.  Aplique compresas de hielo sobre las mamas, a menos que le resulte demasiado incmodo.  Asegrese de que el beb est prendido y se encuentre en la posicin correcta mientras lo alimenta. Si la congestin persiste luego de 48 horas o despus de seguir estas recomendaciones, comunquese con su mdico o un asesor en lactancia. RECOMENDACIONES GENERALES PARA EL CUIDADO DE LA SALUD DURANTE LA LACTANCIA MATERNA  Consuma alimentos saludables. Alterne comidas y colaciones, y coma 3 de cada una por da. Dado que lo que come afecta la leche materna, es posible que algunas comidas hagan que su beb se vuelva ms irritable de lo habitual. Evite comer este tipo de alimentos si percibe que afectan de manera negativa al beb.  Beba leche, jugos de fruta y agua para satisfacer su sed (aproximadamente 10 vasos al da).  Descanse con frecuencia, reljese y tome sus vitaminas prenatales para evitar la fatiga, el estrs y la anemia.  Contine con los autocontroles de la mama.  Evite masticar y fumar tabaco. Las sustancias qumicas de los cigarrillos que pasan a la leche materna y la exposicin al humo ambiental del tabaco pueden daar al beb.  No consuma alcohol ni drogas, incluida la marihuana. Algunos medicamentos, que pueden ser perjudiciales para el beb, pueden pasar a travs de la leche materna. Es importante que consulte a su mdico antes de tomar cualquier medicamento, incluidos todos los medicamentos recetados y de venta libre, as como los suplementos vitamnicos y herbales. Puede quedar embarazada durante la lactancia. Si desea controlar la natalidad, consulte a su mdico cules son las opciones ms seguras para el beb. SOLICITE ATENCIN MDICA SI:  Usted siente que quiere dejar de amamantar o se siente frustrada con la lactancia.  Siente dolor en las mamas o en los pezones.  Sus pezones estn agrietados o sangran.  Sus pechos estn irritados, sensibles o calientes.  Tiene un rea  hinchada en cualquiera de las mamas.  Siente escalofros o fiebre.  Tiene nuseas o vmitos.  Presenta una secrecin de otro lquido distinto de la leche materna de los pezones.  Sus mamas no se llenan antes de amamantar al beb para el quinto da despus del parto.  Se siente triste y deprimida.  El beb est demasiado somnoliento como para comer bien.  El beb tiene problemas para dormir.  Moja menos de 3 paales en 24 horas.  Defeca menos de 3 veces en 24 horas.  La piel del beb o la parte blanca de los ojos se vuelven amarillentas.  El beb no ha aumentado de peso a los 5 das de vida.  SOLICITE ATENCIN MDICA DE INMEDIATO SI:  El beb est muy cansado (letargo) y no se quiere despertar para comer.  Le sube la fiebre sin causa.  Esta informacin no tiene como fin reemplazar el consejo del mdico. Asegrese de hacerle al mdico cualquier pregunta que tenga. Document Released: 10/30/2005 Document Revised: 02/21/2016 Document Reviewed: 04/23/2013 Elsevier Interactive Patient Education  2017 [employer]  

## 2017-01-25 ENCOUNTER — Other Ambulatory Visit: Payer: Self-pay | Admitting: Advanced Practice Midwife

## 2017-01-25 ENCOUNTER — Other Ambulatory Visit: Payer: Self-pay | Admitting: Obstetrics and Gynecology

## 2017-01-25 NOTE — Addendum Note (Signed)
Addended by: Reva BoresPRATT, Carma Dwiggins S on: 01/25/2017 05:27 PM   Modules accepted: Orders, SmartSet

## 2017-01-28 ENCOUNTER — Encounter (HOSPITAL_COMMUNITY): Payer: Self-pay

## 2017-01-28 ENCOUNTER — Inpatient Hospital Stay (HOSPITAL_COMMUNITY)
Admission: RE | Admit: 2017-01-28 | Discharge: 2017-01-30 | DRG: 775 | Disposition: A | Discharge status: Home / Self Care | Payer: Medicaid Other | Source: Ambulatory Visit | Attending: Family Medicine | Admitting: Family Medicine

## 2017-01-28 DIAGNOSIS — O480 Post-term pregnancy: Principal | ICD-10-CM | POA: Diagnosis present

## 2017-01-28 DIAGNOSIS — Z8249 Family history of ischemic heart disease and other diseases of the circulatory system: Secondary | ICD-10-CM | POA: Diagnosis not present

## 2017-01-28 DIAGNOSIS — Z3A40 40 weeks gestation of pregnancy: Secondary | ICD-10-CM

## 2017-01-28 DIAGNOSIS — Z789 Other specified health status: Secondary | ICD-10-CM

## 2017-01-28 DIAGNOSIS — Z3A41 41 weeks gestation of pregnancy: Secondary | ICD-10-CM

## 2017-01-28 LAB — CBC
HCT: 37.6 % (ref 36.0–46.0)
Hemoglobin: 13.3 g/dL (ref 12.0–15.0)
MCH: 32.9 pg (ref 26.0–34.0)
MCHC: 35.4 g/dL (ref 30.0–36.0)
MCV: 93.1 fL (ref 78.0–100.0)
PLATELETS: 265 10*3/uL (ref 150–400)
RBC: 4.04 MIL/uL (ref 3.87–5.11)
RDW: 13.8 % (ref 11.5–15.5)
WBC: 8.1 10*3/uL (ref 4.0–10.5)

## 2017-01-28 LAB — TYPE AND SCREEN
ABO/RH(D): O POS
Antibody Screen: NEGATIVE

## 2017-01-28 LAB — RPR: RPR Ser Ql: NONREACTIVE

## 2017-01-28 MED ORDER — FENTANYL CITRATE (PF) 100 MCG/2ML IJ SOLN
100.0000 ug | INTRAMUSCULAR | Status: DC | PRN
  Administered 2017-01-28 (×3): 100 ug via INTRAVENOUS
  Filled 2017-01-28 (×3): qty 2

## 2017-01-28 MED ORDER — BENZOCAINE-MENTHOL 20-0.5 % EX AERO
1.0000 "application " | INHALATION_SPRAY | CUTANEOUS | Status: DC | PRN
  Filled 2017-01-28: qty 56

## 2017-01-28 MED ORDER — TERBUTALINE SULFATE 1 MG/ML IJ SOLN: 0.2500 mg | Freq: Once | INTRAMUSCULAR | Status: DC | PRN

## 2017-01-28 MED ORDER — SIMETHICONE 80 MG PO CHEW: 80.0000 mg | CHEWABLE_TABLET | ORAL | Status: DC | PRN

## 2017-01-28 MED ORDER — LACTATED RINGERS IV SOLN: 500.0000 mL | INTRAVENOUS | Status: DC | PRN

## 2017-01-28 MED ORDER — ACETAMINOPHEN 325 MG PO TABS: 650.0000 mg | ORAL_TABLET | ORAL | Status: DC | PRN

## 2017-01-28 MED ORDER — ONDANSETRON HCL 4 MG/2ML IJ SOLN: 4.0000 mg | Freq: Four times a day (QID) | INTRAMUSCULAR | Status: DC | PRN

## 2017-01-28 MED ORDER — OXYTOCIN BOLUS FROM INFUSION: 500.0000 mL | Freq: Once | INTRAVENOUS | Status: DC

## 2017-01-28 MED ORDER — LACTATED RINGERS IV SOLN
INTRAVENOUS | Status: DC
  Administered 2017-01-28 (×2): via INTRAVENOUS

## 2017-01-28 MED ORDER — SENNOSIDES-DOCUSATE SODIUM 8.6-50 MG PO TABS
2.0000 | ORAL_TABLET | ORAL | Status: DC
  Administered 2017-01-28 – 2017-01-29 (×2): 2 via ORAL
  Filled 2017-01-28 (×2): qty 2

## 2017-01-28 MED ORDER — TETANUS-DIPHTH-ACELL PERTUSSIS 5-2.5-18.5 LF-MCG/0.5 IM SUSP: 0.5000 mL | Freq: Once | INTRAMUSCULAR | Status: DC

## 2017-01-28 MED ORDER — ONDANSETRON HCL 4 MG/2ML IJ SOLN
4.0000 mg | INTRAMUSCULAR | Status: DC | PRN
Start: 2017-01-28 — End: 2017-01-30

## 2017-01-28 MED ORDER — MEASLES, MUMPS & RUBELLA VAC ~~LOC~~ INJ: 0.5000 mL | INJECTION | Freq: Once | SUBCUTANEOUS | Status: DC

## 2017-01-28 MED ORDER — COCONUT OIL OIL: 1.0000 "application " | TOPICAL_OIL | Status: DC | PRN

## 2017-01-28 MED ORDER — DIBUCAINE 1 % RE OINT: 1.0000 "application " | TOPICAL_OINTMENT | RECTAL | Status: DC | PRN

## 2017-01-28 MED ORDER — OXYCODONE-ACETAMINOPHEN 5-325 MG PO TABS: 1.0000 | ORAL_TABLET | ORAL | Status: DC | PRN

## 2017-01-28 MED ORDER — MISOPROSTOL 25 MCG QUARTER TABLET
25.0000 ug | ORAL_TABLET | ORAL | Status: DC | PRN
  Administered 2017-01-28: 25 ug via VAGINAL
  Filled 2017-01-28: qty 0.25
  Filled 2017-01-28: qty 1

## 2017-01-28 MED ORDER — LIDOCAINE HCL (PF) 1 % IJ SOLN: 30.0000 mL | INTRAMUSCULAR | Status: DC | PRN

## 2017-01-28 MED ORDER — LACTATED RINGERS IV SOLN
500.0000 mL | INTRAVENOUS | Status: DC | PRN
  Administered 2017-01-28: 500 mL via INTRAVENOUS

## 2017-01-28 MED ORDER — OXYCODONE-ACETAMINOPHEN 5-325 MG PO TABS: 2.0000 | ORAL_TABLET | ORAL | Status: DC | PRN

## 2017-01-28 MED ORDER — OXYTOCIN 40 UNITS IN LACTATED RINGERS INFUSION - SIMPLE MED
1.0000 m[IU]/min | INTRAVENOUS | Status: DC
  Administered 2017-01-28: 2 m[IU]/min via INTRAVENOUS

## 2017-01-28 MED ORDER — LACTATED RINGERS IV SOLN: INTRAVENOUS | Status: DC

## 2017-01-28 MED ORDER — SOD CITRATE-CITRIC ACID 500-334 MG/5ML PO SOLN: 30.0000 mL | ORAL | Status: DC | PRN

## 2017-01-28 MED ORDER — ONDANSETRON HCL 4 MG PO TABS: 4.0000 mg | ORAL_TABLET | ORAL | Status: DC | PRN

## 2017-01-28 MED ORDER — DIPHENHYDRAMINE HCL 25 MG PO CAPS: 25.0000 mg | ORAL_CAPSULE | Freq: Four times a day (QID) | ORAL | Status: DC | PRN

## 2017-01-28 MED ORDER — PRENATAL MULTIVITAMIN CH
1.0000 | ORAL_TABLET | Freq: Every day | ORAL | Status: DC
  Administered 2017-01-29 – 2017-01-30 (×2): 1 via ORAL
  Filled 2017-01-28: qty 1

## 2017-01-28 MED ORDER — IBUPROFEN 600 MG PO TABS
600.0000 mg | ORAL_TABLET | Freq: Four times a day (QID) | ORAL | Status: DC
  Administered 2017-01-28 – 2017-01-30 (×8): 600 mg via ORAL
  Filled 2017-01-28 (×8): qty 1

## 2017-01-28 MED ORDER — SODIUM CHLORIDE 0.9% FLUSH: 3.0000 mL | Freq: Two times a day (BID) | INTRAVENOUS | Status: DC

## 2017-01-28 MED ORDER — MISOPROSTOL 25 MCG QUARTER TABLET: 25.0000 ug | ORAL_TABLET | ORAL | Status: DC | PRN

## 2017-01-28 MED ORDER — OXYTOCIN 40 UNITS IN LACTATED RINGERS INFUSION - SIMPLE MED
2.5000 [IU]/h | INTRAVENOUS | Status: DC
  Administered 2017-01-28: 2.5 [IU]/h via INTRAVENOUS
  Filled 2017-01-28: qty 1000

## 2017-01-28 MED ORDER — LIDOCAINE HCL (PF) 1 % IJ SOLN
30.0000 mL | INTRAMUSCULAR | Status: DC | PRN
  Administered 2017-01-28: 30 mL via SUBCUTANEOUS
  Filled 2017-01-28: qty 30

## 2017-01-28 MED ORDER — SODIUM CHLORIDE 0.9% FLUSH: 3.0000 mL | INTRAVENOUS | Status: DC | PRN

## 2017-01-28 MED ORDER — FLEET ENEMA 7-19 GM/118ML RE ENEM: 1.0000 | ENEMA | RECTAL | Status: DC | PRN

## 2017-01-28 MED ORDER — WITCH HAZEL-GLYCERIN EX PADS
1.0000 "application " | MEDICATED_PAD | CUTANEOUS | Status: DC | PRN
  Administered 2017-01-29: 1 via TOPICAL

## 2017-01-28 MED ORDER — TERBUTALINE SULFATE 1 MG/ML IJ SOLN
0.2500 mg | Freq: Once | INTRAMUSCULAR | Status: DC | PRN
  Filled 2017-01-28: qty 1

## 2017-01-28 MED ORDER — OXYTOCIN 40 UNITS IN LACTATED RINGERS INFUSION - SIMPLE MED: 2.5000 [IU]/h | INTRAVENOUS | Status: DC

## 2017-01-28 MED ORDER — SODIUM CHLORIDE 0.9 % IV SOLN: 250.0000 mL | INTRAVENOUS | Status: DC | PRN

## 2017-01-28 MED ORDER — ZOLPIDEM TARTRATE 5 MG PO TABS: 5.0000 mg | ORAL_TABLET | Freq: Every evening | ORAL | Status: DC | PRN

## 2017-01-28 NOTE — Progress Notes (Signed)
[id number] Interpreter requested and at bedside for pt to ask questions and review POC.

## 2017-01-28 NOTE — Progress Notes (Signed)
Dana Wade is a 36 y.o. G3P1011 at 3252w6d by ultrasound admitted for induction of labor due to Post dates. Due date 01/22/17.  Subjective:   Objective: BP (!) 103/45   Pulse 70   Temp 98 F (36.7 C) (Oral)   Resp 18   Ht 5' (1.524 m)   Wt 172 lb (78 kg)   LMP 04/09/2016 (Exact Date)   SpO2 97%   BMI 33.59 kg/m  No intake/output data recorded. No intake/output data recorded.  FHT:  FHR: 125-130 bpm, variability: moderate,  accelerations:  Present,  decelerations:  Absent UC:   regular, every 2-4 minutes SVE:   Dilation: 3.5 Effacement (%): 70 Station: -3, -2 Exam by:: Dana Wade  Labs: Lab Results  Component Value Date   WBC 8.1 01/28/2017   HGB 13.3 01/28/2017   HCT 37.6 01/28/2017   MCV 93.1 01/28/2017   PLT 265 01/28/2017    Assessment / Plan: yet to be in labor  Labor: yet to be in labor Preeclampsia:  no signs or symptoms of toxicity and intake and ouput balanced Fetal Wellbeing:  Category I Pain Control:  Labor support without medications I/D:  n/a Anticipated MOD:  NSVD  Dana Wade 01/28/2017, 9:18 AM

## 2017-01-28 NOTE — Progress Notes (Signed)
Interpreter at bs reviewing pain scale, pain options, POC, clear liquid diet options, and interpreting for CRNA. Pt will let RN know when she is ready for pain relief method.

## 2017-01-28 NOTE — H&P (Signed)
LABOR AND DELIVERY ADMISSION HISTORY AND PHYSICAL NOTE  Dana Wade is a 36 y.o. female G3P1011 with IUP at 5834w6d by12 week US presenting for IOL for postdates.  She is feeling ctx, started 1 week ago, stronger since yesterday evening.  Mucus+bloody show present for the past 24 hours.  She reports positive fetal movement. She denies leakage of fluid.  Prenatal History/Complications:  Past Medical History: Past Medical History:  Diagnosis Date  . Infection    kidney infection 4-5 years ago    Past Surgical History: Past Surgical History:  Procedure Laterality Date  . DILATION AND EVACUATION N/A 12/31/2015   Procedure: DILATATION AND EVACUATION;  Surgeon: Tereso NewcomerUgonna A Anyanwu, MD;  Location: [facility];  Service: Gynecology;  Laterality: N/A;  . INCISION AND DRAINAGE POSTERIOR SACRAL SPINE  2006   fell while pregnant, had large hematoma    Obstetrical History: OB History    Gravida Para Term Preterm AB Living   3 1 1  0 1 1   SAB TAB Ectopic Multiple Live Births   1 0 0 0 1      Social History: Social History   Social History  . Marital status: Single    Spouse name: N/A  . Number of children: N/A  . Years of education: N/A   Social History Main Topics  . Smoking status: Never Smoker  . Smokeless tobacco: Never Used  . Alcohol use No  . Drug use: No  . Sexual activity: Yes    Birth control/ protection: None   Other Topics Concern  . Not on file   Social History Narrative  . No narrative on file    Family History: Family History  Problem Relation Age of Onset  . Hypertension Father     Allergies: No Known Allergies  Prescriptions Prior to Admission  Medication Sig Dispense Refill Last Dose  . Prenatal Multivit-Min-Fe-FA (PRENATAL VITAMINS) 0.8 MG tablet Take 1 tablet by mouth daily. 30 tablet 12 Taking     Review of Systems   All systems reviewed and negative except as stated in HPI  Last menstrual period 04/09/2016, unknown if currently  breastfeeding. General appearance: alert, cooperative and appears stated age Lungs: no respiratory distress Heart: regular rate, 2+ pulses Abdomen: soft, non-tender; gravid Extremities: No calf swelling or tenderness Presentation: cephalic Fetal monitoring: cat I - baseline 125-130, moderate variability, + accels, no decels Uterine activity: irregular every 5-10 minutes   Prenatal labs: ABO, Rh: O/POS/-- (08/21 1050) Antibody: NEG (08/21 1050) Rubella: immune RPR: NON REAC (12/26 1003)  HBsAg: NEGATIVE (08/21 1050)  HIV: NONREACTIVE (12/26 1003)  GBS:   negative 1 hr Glucola: 143/85 (wnl) Genetic screening:  declined Anatomy US: normal, female  Prenatal Transfer Tool  Maternal Diabetes: No Genetic Screening: Declined Maternal Ultrasounds/Referrals: Normal Fetal Ultrasounds or other Referrals:  None Maternal Substance Abuse:  No Significant Maternal Medications:  None Significant Maternal Lab Results: Lab values include: Group B Strep negative  No results found for this or any previous visit (from the past 24 hour(s)).  Patient Active Problem List   Diagnosis Date Noted  . Post term pregnancy at [redacted] weeks gestation 01/28/2017  . Language barrier 01/09/2017  . Advanced maternal age in multigravida   . Supervision of low-risk pregnancy 07/11/2016   Assessment: Dana Wade is a 36 y.o. G3P1011 at 6334w6d here for IOL for post-dates.  #Labor: SVE 2.5/60/-3 (last cervical exam 2/27 1/thick/-3) #Pain: Desires analgesic free #FWB: Category I #ID:  None, repeat RPR  pending #MOF: breast #MOC:condoms #Circ:  n/a  Charlsie Merles 01/28/2017, 1:03 AM  OB FELLOW HISTORY AND PHYSICAL ATTESTATION  I have seen and examined this patient; I agree with above documentation in the resident's note.   Patient is a 36 y/o G3P1 at 40 wk and 6 d for post dates induction. No concerns or complaints today.  Gen: well appearing NAD Pulm: no respiratory distress CV: RR intact distal  pulses Abd: soft NT ND FTHs: category 1 base line 135 reactive toco: occasional contractions  A/P admit for IOL for post dates. cytotec and transition to pitocin in 4 hours.   Ernestina Penna MD 01/28/2017, 1:35 AM

## 2017-01-28 NOTE — Progress Notes (Signed)
Interpreter at bs discussing pt's pain level, helping pt into lateral position.

## 2017-01-28 NOTE — Progress Notes (Signed)
LABOR PROGRESS NOTE  Subjective: Contractions feeling stronger. Doing well.  Objective: BP 111/68   Pulse 69   Temp 97.7 F (36.5 C) (Oral)   Resp 20   Ht 5' (1.524 m)   Wt 172 lb (78 kg)   LMP 04/09/2016 (Exact Date)   SpO2 97%   BMI 33.59 kg/m    Dilation: 3.5 Effacement (%): 70 Cervical Position: Posterior Station: -3, -2 Presentation: Vertex Exam by:: julie Sharod Petsch  toco: ctx q5 min  Assessment / Plan: 36 y.o. G3P1011 at 2774w6d here for IOL for PD  Labor: s/p cytox1, start pitocin, 2x2 Fetal Wellbeing:  Category II, baseline 120, moderate variability, + accels, 1 variable decel at 0537, otherwise cat I strip. Pain Control:  No analgesics desired Anticipated MOD:  vaginal  Charlsie MerlesJulia Tavionna Grout, MD 01/28/2017, 6:00 AM

## 2017-01-28 NOTE — Progress Notes (Signed)
Dana Wade is a 36 y.o. G3P1011 at 9218w6d by ultrasound admitted for induction of labor due to Post dates. Due date 3/12.  Subjective:   Objective: BP 114/68   Pulse 77   Temp 97.9 F (36.6 C) (Axillary)   Resp 20   Ht 5' (1.524 m)   Wt 172 lb (78 kg)   LMP 04/09/2016 (Exact Date)   SpO2 97%   BMI 33.59 kg/m  No intake/output data recorded. No intake/output data recorded.  FHT:  FHR: 120's bpm, variability: moderate,  accelerations:  Present,  decelerations:  Absent UC:   regular, every 2-3 minutes SVE:   Dilation: 6 Effacement (%): 90 Station: 0 Exam by:: [email or web address],rnc-ob  Labs: Lab Results  Component Value Date   WBC 8.1 01/28/2017   HGB 13.3 01/28/2017   HCT 37.6 01/28/2017   MCV 93.1 01/28/2017   PLT 265 01/28/2017    Assessment / Plan: Induction of labor due to postterm,  progressing well on pitocin  Labor: Progressing normally Preeclampsia:  no signs or symptoms of toxicity and intake and ouput balanced Fetal Wellbeing:  Category I Pain Control:  Labor support without medications I/D:  n/a Anticipated MOD:  NSVD  Wyvonnia DuskyMarie Sybella Harnish 01/28/2017, 1:11 PM

## 2017-01-28 NOTE — Anesthesia Pain Management Evaluation Note (Signed)
  CRNA Pain Management Visit Note  Patient: Dana Wade, 36 y.o., female  "Hello I am a member of the anesthesia [facility]. We have an anesthesia team available at all times to provide care throughout the hospital, including epidural management and anesthesia for C-section. I don't know your plan for the delivery whether it a natural birth, water birth, IV sedation, nitrous supplementation, doula or epidural, but we want to meet your pain goals."   1.Was your pain managed to your expectations on prior hospitalizations?   Yes   2.What is your expectation for pain management during this hospitalization?     IV pain meds  3.How can we help you reach that goal? Iv pain meds  Record the patient's initial score and the patient's pain goal.   Pain: 7  Pain Goal: 8 [facility] wants you to be able to say your pain was always managed very well.  Madison HickmanGREGORY,Rache Klimaszewski 01/28/2017

## 2017-01-29 NOTE — Discharge Summary (Signed)
OB Discharge Summary     Patient Name: Dana Wade DOB: [birth year] MRN: [id number]  Date of admission: 01/28/2017 Delivering MD: Greer Ee   Date of discharge: 01/29/2017  Admitting diagnosis: INDUCTION Intrauterine pregnancy: [redacted]w[redacted]d     Secondary diagnosis:  Active Problems:   Post term pregnancy at [redacted] weeks gestation   Post-dates pregnancy  Additional problems: None     Discharge diagnosis: Term Pregnancy Delivered                                                                                                Post partum procedures:None  Augmentation: None  Complications: None  Hospital course:  Induction of Labor With Vaginal Delivery   36 y.o. yo [id number] at [redacted]w[redacted]d was admitted to the hospital 01/28/2017 for induction of labor.  Indication for induction: Postdates.  Patient had an uncomplicated labor course as follows: Membrane Rupture Time/Date: 2:45 PM ,01/28/2017   Intrapartum Procedures: Episiotomy: None [1]                                         Lacerations:  1st degree [2];Perineal [11]  Patient had delivery of a Viable infant.  Information for the patient's newborn:  Evert Kohl Girl [name] [[id number]]  Delivery Method: Vaginal, Spontaneous Delivery (Filed from Delivery Summary)   01/28/2017  Details of delivery can be found in separate delivery note.  Patient had a routine postpartum course. Patient is discharged home 01/29/17.  Physical exam  Vitals:   01/28/17 1850 01/28/17 2010 01/28/17 2343 01/29/17 0645  BP: (!) 98/51 (!) 98/55 (!) 87/53 (!) 110/91  Pulse: 70 72 77 78  Resp: 16 20 18 18   Temp: 98.7 F (37.1 C) 98.9 F (37.2 C) 98.5 F (36.9 C) 98.3 F (36.8 C)  TempSrc: Axillary Oral Oral Oral  SpO2:      Weight:      Height:       General: alert, cooperative and no distress Lochia: appropriate Uterine Fundus: firm Incision: Healing well with no significant drainage, No significant erythema DVT Evaluation: No evidence of DVT  seen on physical exam. Negative Homan's sign. No cords or calf tenderness. No significant calf/ankle edema. Labs: Lab Results  Component Value Date   WBC 8.1 01/28/2017   HGB 13.3 01/28/2017   HCT 37.6 01/28/2017   MCV 93.1 01/28/2017   PLT 265 01/28/2017   CMP Latest Ref Rng & Units 06/19/2016  Glucose 65 - 99 mg/dL 84  BUN 6 - 20 mg/dL 9  Creatinine 6.96 - 2.95 mg/dL 2.84  Sodium 132 - 440 mmol/L 136  Potassium 3.5 - 5.1 mmol/L 3.8  Chloride 101 - 111 mmol/L 106  CO2 22 - 32 mmol/L 21(L)  Calcium 8.9 - 10.3 mg/dL 9.8  Total Protein 6.5 - 8.1 g/dL 6.6  Total Bilirubin 0.3 - 1.2 mg/dL 0.4  Alkaline Phos 38 - 126 U/L 45  AST 15 - 41 U/L 17  ALT 14 - 54 U/L 18    Discharge  instruction: per After Visit Summary and "Baby and Me Booklet".  After visit meds:  Allergies as of 01/29/2017   No Known Allergies     Medication List    STOP taking these medications   Prenatal Vitamins 0.8 MG tablet       Diet: routine diet  Activity: Advance as tolerated. Pelvic rest for 6 weeks.   Outpatient follow WJ:XBJYup:appt 4/25 Follow up Appt:Future Appointments Date Time Provider Department Center  03/07/2017 8:40 AM Hurshel PartyLisa A Leftwich-Kirby, CNM [facility]   Follow up Visit:No Follow-up on file.  Postpartum contraception: Condoms  Newborn Data: Live born female  Birth Weight: 8 lb 2.9 oz (3710 g) APGAR: 9, 9  Baby Feeding: Breast Disposition:home with mother   01/29/2017 Wendee Beaversavid J McMullen, DO, PGY-1    OB FELLOW POSTPARTUM PROGRESS NOTE ATTESTATION  I have seen and examined this patient and agree with above documentation in the resident's note. Was initially going to discharge patient home today, but patient states after seeing resident this AM, she got up to shower and felt dizzy. Her fundus if firm, her VSS without tachycardia. Will keep until tomorrow for now.   Jen MowElizabeth Elias Bordner, DO OB Fellow 11:17 AM

## 2017-01-29 NOTE — Discharge Instructions (Signed)

## 2017-01-29 NOTE — Lactation Note (Signed)
This note was copied from a baby's chart. Lactation Consultation Note  Patient Name: Dana Wade ZOXWR'UToday's Date: 01/29/2017 Reason for consult: Initial assessment   Initial consult with first time mom of 3618 hour old infant. Spoke with mom via [employer] Steffany # [phone] [facility](Hospital Interpreter not available at this time) Infant with 6 BF for 10-20 minutes, 4 formula feeds via bottle of 5-10 cc, 3 voids and 4 stool since birth. LATCH score of 7. Mom reports she BF her 36 yo for 1 year.   Mom reports she has no milk. Discussed colostrum, supply and demand and milk coming to volume. Enc mom to offer breast STS 8-12 x in 24 hour at first feeding cues and to offer breast prior to formula. Mom reports some nipple tenderness with initial latch that improves with feeding. Feeding log given with instructions for use.   BF Resources Handout and LC Brochure given, mom informed of IP/OP Services, BF Support Groups and LC phone #. [facility] and is aware she can get a pump from them. Manual pump given to mom for home use. Mom without questions/concerns at this time.        Maternal Data Formula Feeding for Exclusion: Yes Reason for exclusion: Mother's choice to formula and breast feed on admission Has patient been taught Hand Expression?: Yes Does the patient have breastfeeding experience prior to this delivery?: Yes  Feeding Feeding Type: Formula Nipple Type: Slow - flow Length of feed: 20 min  LATCH Score/Interventions                      Lactation Tools Discussed/Used WIC Program: Yes   Consult Status Consult Status: Follow-up Date: 01/30/17 Follow-up type: In-patient    Silas FloodSharon S Oasis Goehring 01/29/2017, 11:45 AM

## 2017-01-29 NOTE — Progress Notes (Signed)
I ordered patients meals, by Orlan LeavensViria Alvarez Spanish Medical Interpreter.

## 2017-01-30 NOTE — Lactation Note (Signed)
This note was copied from a baby's chart. Lactation Consultation Note  Patient Name: Girl Theola SequinMarilu Munoz-Garcia ZOXWR'UToday's Date: 01/30/2017 Reason for consult: Follow-up assessment   Follow up with Exp BF mom of 41 hour old infant. Spoke with mother with assistance of Eda Royal, [employer]. Infant asleep in FOB arms. Mom reports she feels BF is going well. She reports her milk is not in and she will continue to give formula until it is in.   [facility] and plans to call and make an appointment. Enc mom to call with questions/concerns. Mom denies questions/concerns at this time. Engorgement prevention/treatment reviewed.    Maternal Data Formula Feeding for Exclusion: Yes Reason for exclusion: Mother's choice to formula and breast feed on admission Has patient been taught Hand Expression?: Yes Does the patient have breastfeeding experience prior to this delivery?: Yes  Feeding Length of feed: 25 min  LATCH Score/Interventions                      Lactation Tools Discussed/Used WIC Program: Yes   Consult Status Consult Status: Complete Follow-up type: Call as needed    Ed BlalockSharon S Kosisochukwu [place]ingham 01/30/2017, 10:29 AM

## 2017-01-30 NOTE — Discharge Summary (Signed)
Obstetric Discharge Summary Reason for Admission: induction of labor Prenatal Procedures: none Intrapartum Procedures: spontaneous vaginal delivery Postpartum Procedures: none Complications-Operative and Postpartum: 1st degree perineal laceration Hemoglobin  Date Value Ref Range Status  01/28/2017 13.3 12.0 - 15.0 g/dL Final   HCT  Date Value Ref Range Status  01/28/2017 37.6 36.0 - 46.0 % Final   Vitals:   01/28/17 2343 01/29/17 0645 01/29/17 1813 01/30/17 0538  BP: (!) 87/53 (!) 110/91 (!) 94/56 102/67  Pulse: 77 78 79 73  Resp: 18 18 18 18   Temp: 98.5 F (36.9 C) 98.3 F (36.8 C) 98 F (36.7 C) 98.2 F (36.8 C)  TempSrc: Oral Oral Oral Oral  SpO2:      Weight:      Height:        Physical Exam:  General: alert, cooperative and no distress Lochia: appropriate Uterine Fundus: firm DVT Evaluation: No evidence of DVT seen on physical exam.  Discharge Diagnoses: Term Pregnancy-delivered  Discharge Information: Date: 01/30/2017 Activity: unrestricted Diet: routine Medications: PNV Condition: stable Instructions: refer to practice specific booklet Discharge to: home   Allergies as of 01/30/2017   No Known Allergies     Medication List    STOP taking these medications   Prenatal Vitamins 0.8 MG tablet       Follow-up Information    [name], CNM. Go on 03/07/2017.   Specialty:  Obstetrics and Gynecology Why:  Go to appt at 8:40. Contact information: [address] [place] [address] [phone]           Newborn Data: Live born female  Birth Weight: 8 lb 2.9 oz (3710 g) APGAR: 9, 9  Home with mother.  Dana Wade 01/30/2017, 7:54 AM   OB FELLOW DISCHARGE ATTESTATION  I have seen and examined this patient and agree with above documentation in the resident's note.   Jen MowElizabeth Terisha Losasso, DO OB Fellow 8:42 AM

## 2017-03-07 ENCOUNTER — Encounter: Payer: Self-pay | Admitting: Advanced Practice Midwife

## 2017-03-07 ENCOUNTER — Ambulatory Visit (INDEPENDENT_AMBULATORY_CARE_PROVIDER_SITE_OTHER): Payer: Self-pay | Admitting: Advanced Practice Midwife

## 2017-03-07 DIAGNOSIS — Z789 Other specified health status: Secondary | ICD-10-CM

## 2017-03-07 NOTE — Progress Notes (Signed)
Subjective:     Dana Wade is a 36 y.o. female who presents for a postpartum visit. She is 5 weeks postpartum following a spontaneous vaginal delivery. I have fully reviewed the prenatal and intrapartum course. The delivery was at [redacted]w[redacted]d gestational weeks. Outcome: spontaneous vaginal delivery. Anesthesia: local. Postpartum course has been normal. Baby's course has been normal. Baby is feeding by both breast and bottle - Similac Advance. Bleeding staining only. Bowel function is mild occasional constipation. Bladder function is normal. Patient is not sexually active. Contraception method is condoms. Postpartum depression screening: negative.  The following portions of the patient's history were reviewed and updated as appropriate: allergies, current medications, past family history, past medical history, past social history, past surgical history and problem list.  Review of Systems A comprehensive review of systems was negative.   Objective:  BP 104/61   Pulse 67   Wt 147 lb 12.8 oz (67 kg)   Breastfeeding? Yes   BMI 28.87 kg/m      VS reviewed, nursing note reviewed,  Constitutional: well developed, well nourished, no distress HEENT: normocephalic, thyroid without enlargement or mass HEART: normal rate, heart sounds, regular rhythm RESP: normal effort, lung sounds clear and equal bilaterally Abdomen: soft Neuro: alert and oriented x 3 Skin: warm, dry Psych: affect normal   Assessment:   1. Postpartum care and examination   2. Language barrier --hospital spanish interpreter used    Plan:    1. Contraception: condoms. Discussed LARCs and other forms of contraception, reviewed effectiveness/failure rates for condoms, LARCs, and other methods. Pt desires to use condoms at this time.  [facility] to f[facility]u at [facility] if she desires hormonal or other long term contraception.   2. High fiber diet, increase fluids, use Colace for constipation 3. Follow up in: 1 year or as needed.

## 2017-12-27 IMAGING — US US MFM FETAL NUCHAL TRANSLUCENCY
1 series · 15 of 25 positions shown · non-contrast
Comparison: none

[Series 1: us mfm fetal nuchal translucency · 15 of 25 slices shown]
[im 1/25]
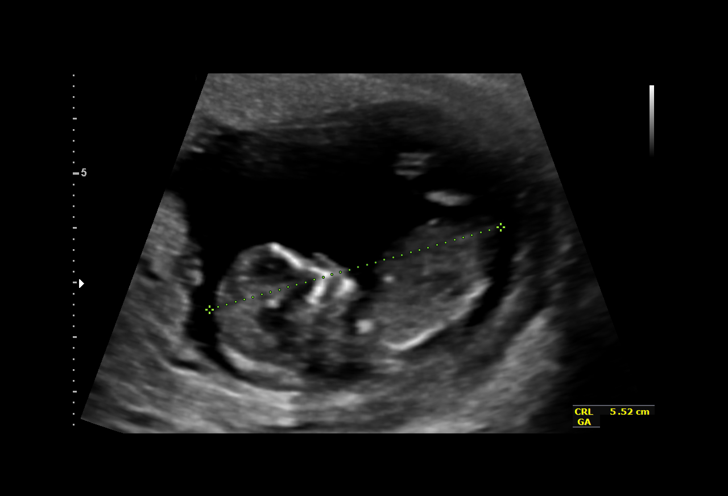
[im 3/25]
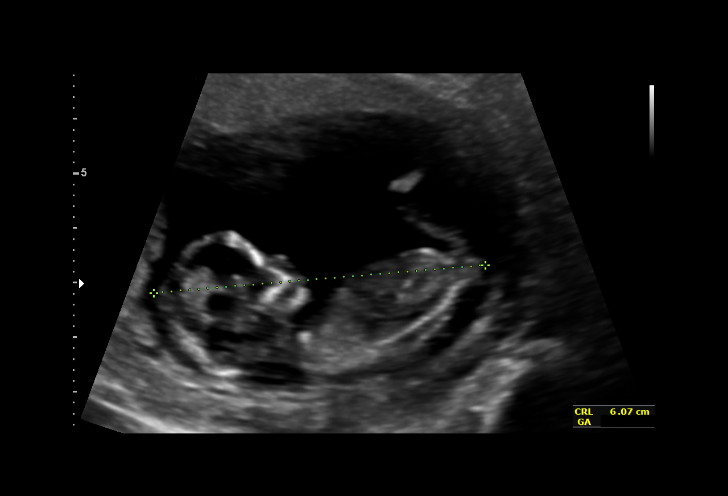
[im 5/25]
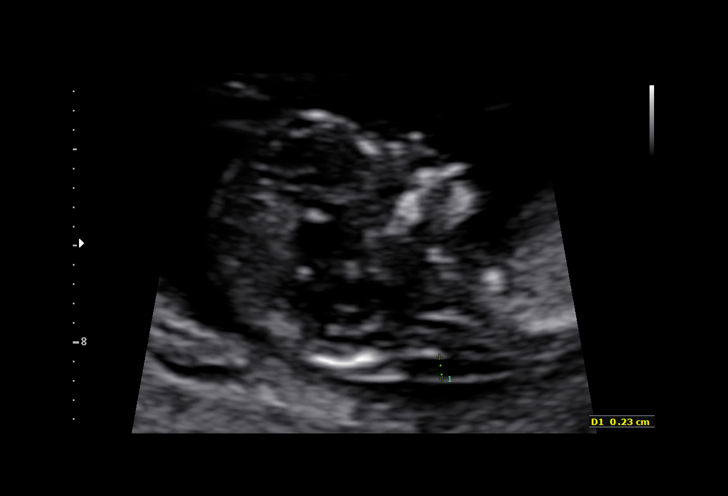
[im 6/25]
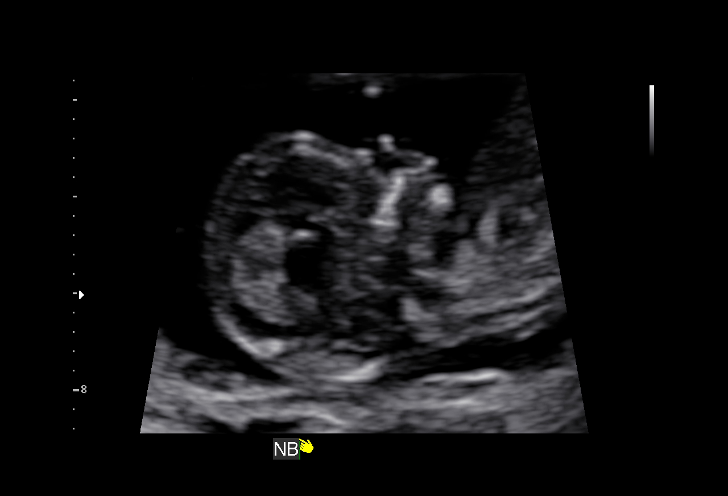
[im 8/25]
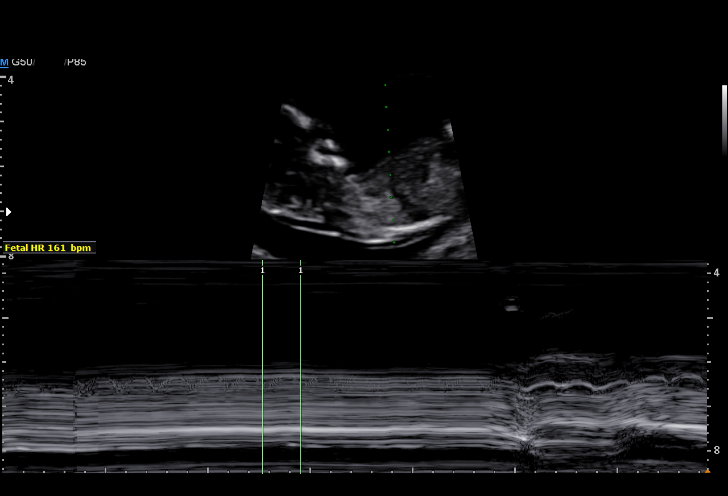
[im 10/25]
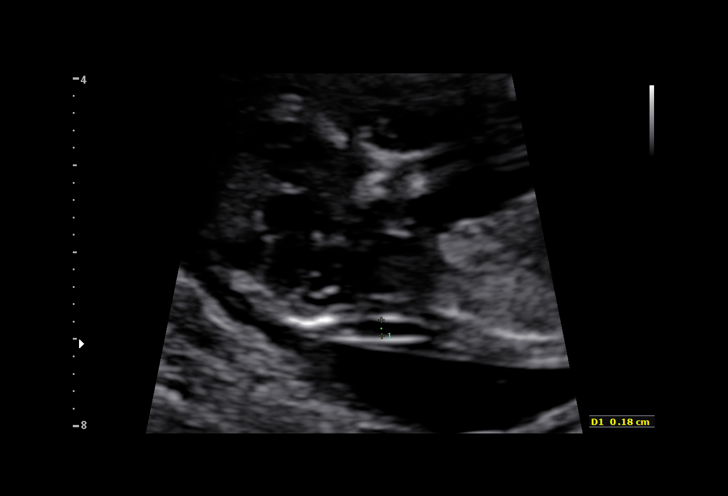
[im 11/25]
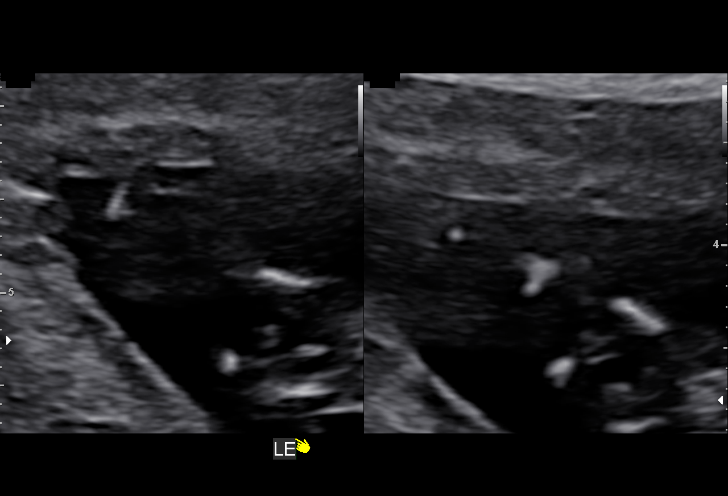
[im 13/25]
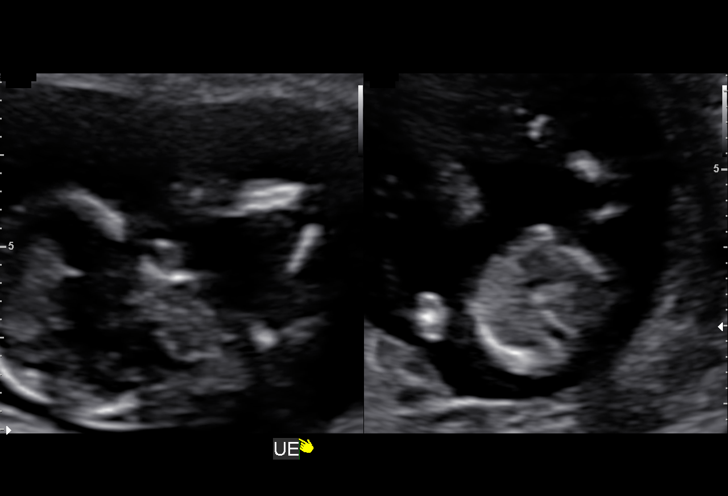
[im 15/25]
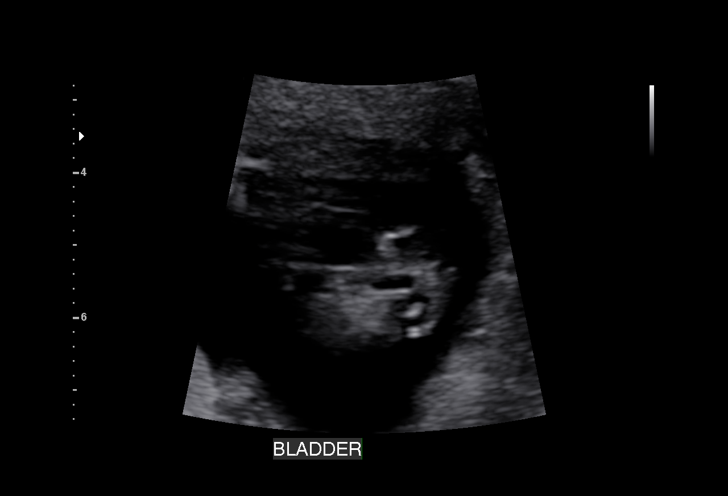
[im 16/25]
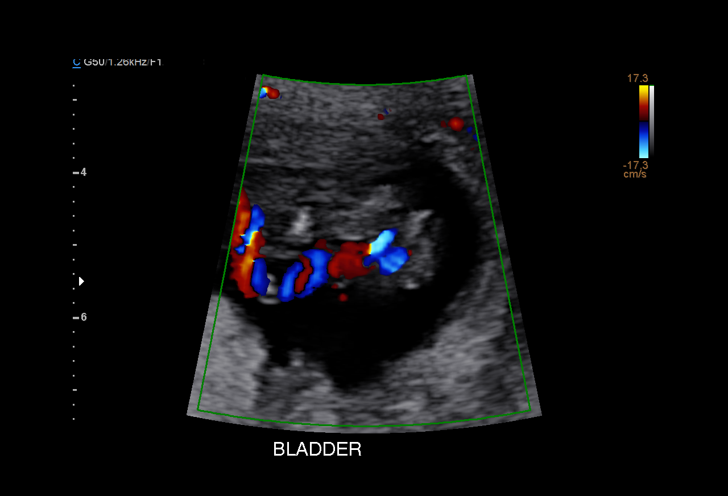
[im 18/25]
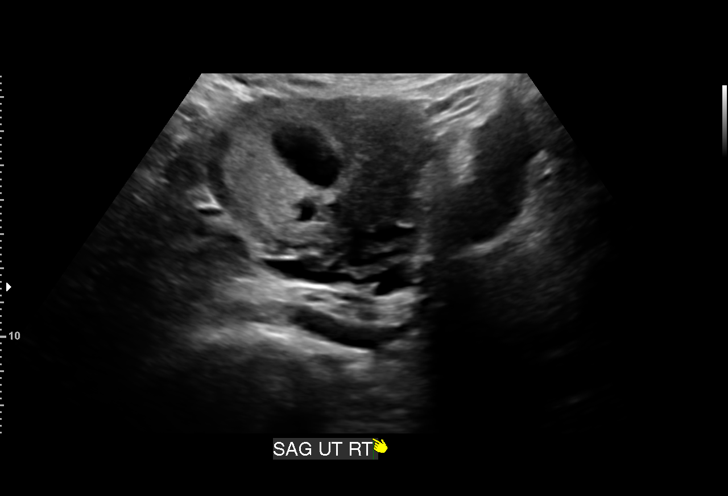
[im 20/25]
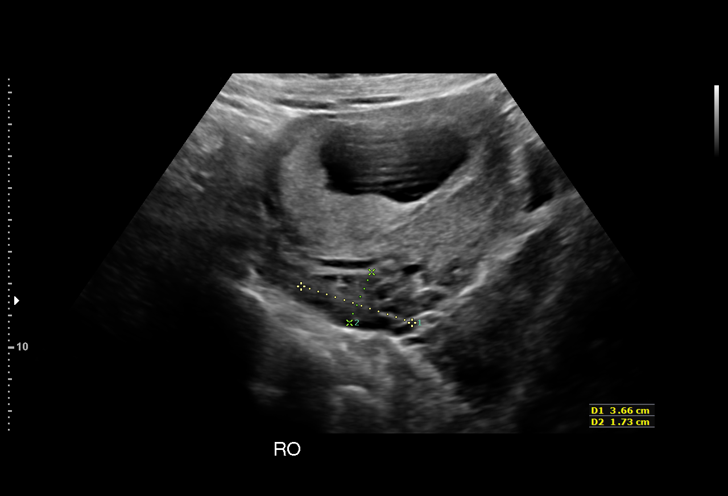
[im 21/25]
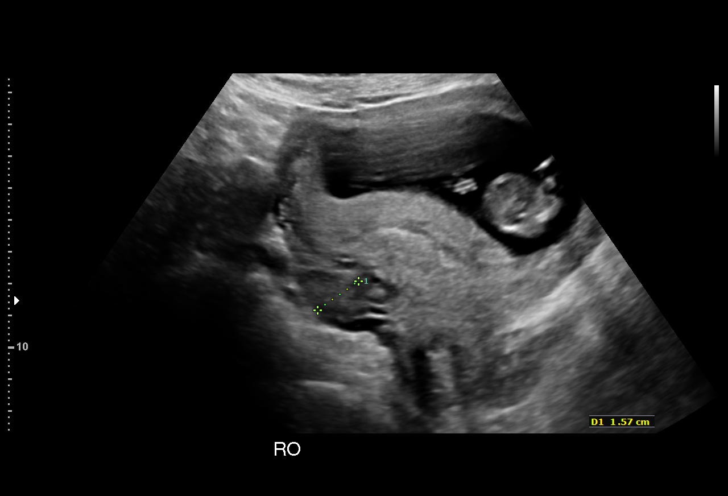
[im 23/25]
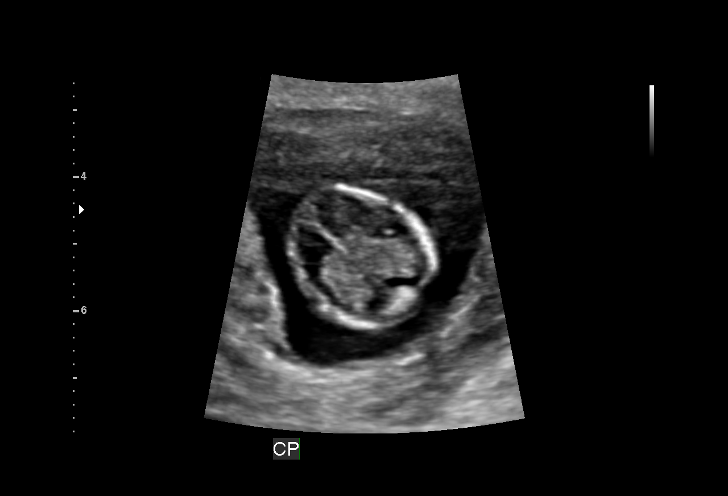
[im 25/25]
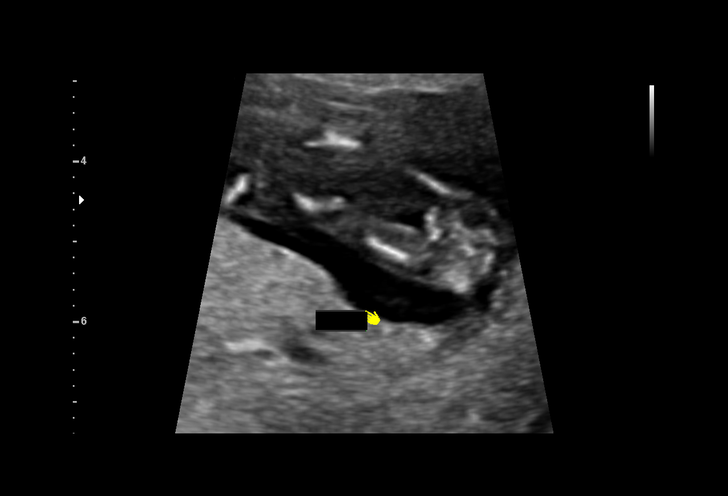

[15 of 25 positions shown; findings below may reference images not displayed]

[REDACTED]

TRANSLUCENCY

1  LABELLE SALHA             [id number]      [id number]     [id number]
Indications

First trimester aneuploidy screen (NT)         Z36
12 weeks gestation of pregnancy
Advanced maternal age multigravida 35+,
first trimester
OB History

Gravidity:    3         Term:   1        Prem:   0        SAB:   1
TOP:          0       Ectopic:  0        Living: 1
Fetal Evaluation

Num Of Fetuses:     1
Preg. Location:     Intrauterine
Gest. Sac:          Intrauterine
Fetal Pole:         Visualized
Fetal Heart         161
Rate(bpm):
Cardiac Activity:   Observed
Gestational Age

LMP:           13w 3d       Date:   04/09/16                 EDD:   01/14/17
1st Trimester Genetic Sonogram Screening

CRL:            58.9  mm    G. Age:   12w 2d                 EDD:   01/22/17
Nuc Trans:       2.3  mm

Nasal Bone:                 Present
Anatomy

Cranium:               Appears normal         Bladder:                Appears normal
Choroid Plexus:        Appears normal         Upper Extremities:      Visualized
Stomach:               Appears normal, left   Lower Extremities:      Visualized
sided
Abdomen:               Appears normal
Impression

SIUP at 12+2 weeks
No gross abnormalities identified
NT measurement was within normal limits for this GA; NB
present
Normal amniotic fluid volume
EDC based on today's measurements

After genetic counseling, Ms. BJARNI HAUKUR CRUZ MOTA decided to
postpone testing until after the detailed anatomic survey.
Recommendations

Offer MSAFP in the second trimester for ONTD screening
Offer detailed U/S by 18 weeks

## 2018-06-29 ENCOUNTER — Inpatient Hospital Stay (HOSPITAL_COMMUNITY): Payer: Self-pay

## 2018-06-29 ENCOUNTER — Inpatient Hospital Stay (HOSPITAL_COMMUNITY)
Admission: AD | Admit: 2018-06-29 | Discharge: 2018-06-29 | Disposition: A | Discharge status: Home / Self Care | Payer: Self-pay | Attending: Family Medicine | Admitting: Family Medicine

## 2018-06-29 ENCOUNTER — Encounter (HOSPITAL_COMMUNITY): Payer: Self-pay

## 2018-06-29 DIAGNOSIS — O26899 Other specified pregnancy related conditions, unspecified trimester: Secondary | ICD-10-CM

## 2018-06-29 DIAGNOSIS — Z3A01 Less than 8 weeks gestation of pregnancy: Secondary | ICD-10-CM | POA: Insufficient documentation

## 2018-06-29 DIAGNOSIS — R109 Unspecified abdominal pain: Secondary | ICD-10-CM | POA: Insufficient documentation

## 2018-06-29 DIAGNOSIS — O468X1 Other antepartum hemorrhage, first trimester: Secondary | ICD-10-CM

## 2018-06-29 DIAGNOSIS — O26891 Other specified pregnancy related conditions, first trimester: Secondary | ICD-10-CM | POA: Insufficient documentation

## 2018-06-29 DIAGNOSIS — O418X10 Other specified disorders of amniotic fluid and membranes, first trimester, not applicable or unspecified: Secondary | ICD-10-CM

## 2018-06-29 DIAGNOSIS — O208 Other hemorrhage in early pregnancy: Secondary | ICD-10-CM | POA: Insufficient documentation

## 2018-06-29 LAB — URINALYSIS, ROUTINE W REFLEX MICROSCOPIC
BILIRUBIN URINE: NEGATIVE
GLUCOSE, UA: NEGATIVE mg/dL
HGB URINE DIPSTICK: NEGATIVE
Ketones, ur: NEGATIVE mg/dL
NITRITE: NEGATIVE
Protein, ur: NEGATIVE mg/dL
SPECIFIC GRAVITY, URINE: 1.026 (ref 1.005–1.030)
pH: 6 (ref 5.0–8.0)

## 2018-06-29 LAB — WET PREP, GENITAL
Clue Cells Wet Prep HPF POC: NONE SEEN
SPERM: NONE SEEN
TRICH WET PREP: NONE SEEN
YEAST WET PREP: NONE SEEN

## 2018-06-29 NOTE — Discharge Instructions (Signed)
Hematoma subcoriónico °(Subchorionic Hematoma) °Un hematoma subcoriónico es una acumulación de sangre entre la pared externa de la placenta y la pared interna del la matriz (útero). La placenta es el órgano que conecta el feto a la pared del útero. La placenta realiza la función de alimentación, respiración (oxígeno al feto) y el trabajo de eliminación de desechos (excreción) del feto. °Un hematoma subcoriónico es la anormalidad más frecuente encontrada en una ecografía durante el primer trimestre o principios del segundo trimestre del embarazo. Si ha habido poca o ninguna hemorragia vaginal, generalmente los pequeños hematomas se reducen por su propia cuenta y no afectan al bebé ni al embarazo. La sangre es absorbida gradualmente durante una o dos semanas. Cuando la hemorragia comienza más tarde en el embarazo o el hematoma es más grande o se produce en una paciente de edad avanzada, el resultado puede no ser tan bueno. Los grandes hematomas pueden agrandarse aún más y aumenta las posibilidades de aborto espontáneo. El hematoma subcoriónico también aumenta el riesgo de desprendimiento precoz de la placenta del útero, muerte fetal y parto prematuro. °INSTRUCCIONES PARA EL CUIDADO EN EL HOGAR °· Repose en cama si el médico se lo recomienda. Aunque el reposo en cama no evitará la hemorragia o un aborto espontáneo, su médico puede recomendarlo. °· Evite levantar objetos pesados (más de 10 libras [4,5 kg]), hacer ejercicio, tener relaciones sexuales o realizar duchas vaginales según se lo indique el profesional. °· Lleve un registro de la cantidad y grado de remojo (saturación) de las toallas higiénicas que utiliza cada día. Anote esta información. °· No use tampones. °· Cumpla con todas las visitas de control, según le indique su médico. El profesional podrá pedirle que se realice análisis de seguimiento, pruebas de ultrasonido o ambas. ° °SOLICITE ATENCIÓN MÉDICA DE INMEDIATO SI: °· Siente calambres intensos en el  estómago, en la espalda, en el abdomen o en la pelvis. °· Tiene fiebre. °· Elimina coágulos o tejidos grandes. Guarde los tejidos para que su médico los vea. °· Si la hemorragia aumenta o siente mareos, debilidad o tiene episodios de desmayos. ° °Esta información no tiene como fin reemplazar el consejo del médico. Asegúrese de hacerle al médico cualquier pregunta que tenga. °Document Released: 02/15/2009 Document Revised: 08/20/2013 Document Reviewed: 05/29/2013 °Elsevier Interactive Patient Education © 2017 [employer] ° °

## 2018-06-29 NOTE — MAU Provider Note (Addendum)
Patient Dana Wade is 37 y.o. [id number] At 7967w4d here with complaints of bleeding when she wiped. She is not sure if it is from her uterus or from her urine. She denies nausea, dysuria, vomiting, abdominal pain or other ob-gyn complaints.   She has not had an US in this pregnancy. [facility] beginning of [facility].  History     CSN: [id number]  Arrival date and time: 06/29/18 0753   None     Chief Complaint  Patient presents with  . Hematuria   Hematuria  This is a new problem. The current episode started today. The problem has been resolved since onset. The hematuria occurs during the initial portion of her urinary stream. Irritative symptoms do not include urgency. Pertinent negatives include no vomiting.  Vaginal Bleeding  The patient's primary symptoms include vaginal bleeding. This is a new problem. The current episode started today. The problem occurs rarely. The problem has been resolved. The patient is experiencing no pain. She is pregnant. Associated symptoms include hematuria. Pertinent negatives include no constipation, diarrhea, urgency or vomiting.    OB History    Gravida  4   Para  2   Term  2   Preterm  0   AB  1   Living  2     SAB  1   TAB  0   Ectopic  0   Multiple  0   Live Births  2           Past Medical History:  Diagnosis Date  . Infection    kidney infection 4-5 years ago    Past Surgical History:  Procedure Laterality Date  . DILATION AND EVACUATION N/A 12/31/2015   Procedure: DILATATION AND EVACUATION;  Surgeon: Tereso NewcomerUgonna A Anyanwu, MD;  Location: [facility];  Service: Gynecology;  Laterality: N/A;  . INCISION AND DRAINAGE POSTERIOR SACRAL SPINE  2006   fell while pregnant, had large hematoma    Family History  Problem Relation Age of Onset  . Hypertension Father     Social History   Tobacco Use  . Smoking status: Never Smoker  . Smokeless tobacco: Never Used  Substance Use Topics  . Alcohol  use: No  . Drug use: No    Allergies: No Known Allergies  No medications prior to admission.    Review of Systems  Gastrointestinal: Negative for constipation, diarrhea and vomiting.  Genitourinary: Positive for hematuria and vaginal bleeding. Negative for urgency.   Physical Exam   Blood pressure 113/63, pulse 69, temperature 98.2 F (36.8 C), temperature source Oral, resp. rate 16, weight 75.8 kg, last menstrual period 04/30/2018, currently breastfeeding.  Physical Exam  Constitutional: She is oriented to person, place, and time. She appears well-developed.  HENT:  Head: Normocephalic.  Neck: Normal range of motion.  GI: Soft.  Genitourinary:  Genitourinary Comments: Normal external female genitalia; no blood in the vagina. No CMT, suprapubic or adnexal tenderness.   Neurological: She is alert and oriented to person, place, and time.  Skin: Skin is warm and dry.  Psychiatric: She has a normal mood and affect.    MAU Course  Procedures  MDM -lab work to rule out ectopic; US shows embryo with cardiac activity and small SCH. I have personally reviewed the US results.  -wet prep negative -GC chlamydia pending.  -UA shows no blood in her urine; urine culture not sent.  Assessment and Plan  1.  1. Subchorionic hematoma  in first trimester, single or unspecified fetus   2. Abdominal pain affecting pregnancy    2. Patient stable for discharge with recommendations [facility].   3. Reviewed bleeding precautions and when to return to MAU.   4. GC chlamydia pending.   Charlesetta GaribaldiKathryn Lorraine Lanore Renderos 06/29/2018, 11:59 AM

## 2018-06-29 NOTE — MAU Note (Signed)
Pt states she had some bleeding when she wiped after urinating. Unsure if it's urinary or vaginal. Is [redacted] wks pregnant. Some lower abdominal cramping 5/10. Confirmation of pregnancy shown to triage nurse from health department. Will scan records.

## 2018-07-01 LAB — CULTURE, OB URINE

## 2018-07-01 LAB — GC/CHLAMYDIA PROBE AMP (~~LOC~~) NOT AT ARMC
Chlamydia: NEGATIVE
Neisseria Gonorrhea: NEGATIVE

## 2018-07-02 ENCOUNTER — Telehealth: Payer: Self-pay | Admitting: Medical

## 2018-07-02 DIAGNOSIS — N390 Urinary tract infection, site not specified: Principal | ICD-10-CM

## 2018-07-02 DIAGNOSIS — B952 Enterococcus as the cause of diseases classified elsewhere: Secondary | ICD-10-CM

## 2018-07-02 MED ORDER — CEPHALEXIN 500 MG PO CAPS: 500.0000 mg | ORAL_CAPSULE | Freq: Four times a day (QID) | ORAL | 0 refills | Status: DC

## 2018-07-02 NOTE — Telephone Encounter (Signed)
Called patient with [employer]. Patient informed of UTI and Rx for Keflex sent to patient's pharmacy of choice. Pharmacy confirmed. NKDA confirmed. Patient advised to complete course of antibiotics. Increased PO hydration advised. Patient voiced understanding and all questions were answered. Patient voiced understanding.   Marny LowensteinWenzel, Julie N, PA-C 07/02/2018 11:39 AM

## 2018-08-20 ENCOUNTER — Other Ambulatory Visit (HOSPITAL_COMMUNITY): Payer: Self-pay | Admitting: Nurse Practitioner

## 2018-08-20 ENCOUNTER — Ambulatory Visit (HOSPITAL_COMMUNITY)
Admission: RE | Admit: 2018-08-20 | Discharge: 2018-08-20 | Disposition: A | Discharge status: Home / Self Care | Payer: Medicaid Other | Source: Ambulatory Visit | Attending: Family Medicine | Admitting: Family Medicine

## 2018-08-20 ENCOUNTER — Other Ambulatory Visit (HOSPITAL_COMMUNITY): Payer: Self-pay | Admitting: *Deleted

## 2018-08-20 ENCOUNTER — Ambulatory Visit (HOSPITAL_COMMUNITY): Admission: RE | Admit: 2018-08-20 | Payer: Self-pay | Source: Ambulatory Visit

## 2018-08-20 ENCOUNTER — Encounter (HOSPITAL_COMMUNITY): Payer: Self-pay

## 2018-08-20 DIAGNOSIS — Z3A14 14 weeks gestation of pregnancy: Secondary | ICD-10-CM | POA: Diagnosis not present

## 2018-08-20 DIAGNOSIS — O09521 Supervision of elderly multigravida, first trimester: Secondary | ICD-10-CM | POA: Diagnosis not present

## 2018-08-20 DIAGNOSIS — O09522 Supervision of elderly multigravida, second trimester: Secondary | ICD-10-CM

## 2018-08-20 DIAGNOSIS — Z3682 Encounter for antenatal screening for nuchal translucency: Secondary | ICD-10-CM

## 2018-08-20 DIAGNOSIS — O99211 Obesity complicating pregnancy, first trimester: Secondary | ICD-10-CM | POA: Diagnosis not present

## 2018-08-20 DIAGNOSIS — Z363 Encounter for antenatal screening for malformations: Secondary | ICD-10-CM

## 2018-08-20 DIAGNOSIS — Z3689 Encounter for other specified antenatal screening: Secondary | ICD-10-CM

## 2018-08-26 ENCOUNTER — Ambulatory Visit (HOSPITAL_COMMUNITY)
Admission: RE | Admit: 2018-08-26 | Discharge: 2018-08-26 | Disposition: A | Discharge status: Home / Self Care | Payer: Self-pay | Source: Ambulatory Visit | Attending: Family Medicine | Admitting: Family Medicine

## 2018-09-24 ENCOUNTER — Encounter (HOSPITAL_COMMUNITY): Payer: Self-pay

## 2018-09-24 ENCOUNTER — Ambulatory Visit (HOSPITAL_COMMUNITY)
Admission: RE | Admit: 2018-09-24 | Discharge: 2018-09-24 | Disposition: A | Discharge status: Home / Self Care | Payer: Medicaid Other | Source: Ambulatory Visit | Attending: Family Medicine | Admitting: Family Medicine

## 2018-09-24 ENCOUNTER — Ambulatory Visit (HOSPITAL_COMMUNITY)
Admission: RE | Admit: 2018-09-24 | Discharge: 2018-09-24 | Disposition: A | Discharge status: Home / Self Care | Payer: Self-pay | Source: Ambulatory Visit | Attending: Family Medicine | Admitting: Family Medicine

## 2018-09-24 ENCOUNTER — Other Ambulatory Visit (HOSPITAL_COMMUNITY): Payer: Self-pay | Admitting: *Deleted

## 2018-09-24 DIAGNOSIS — O4442 Low lying placenta NOS or without hemorrhage, second trimester: Secondary | ICD-10-CM | POA: Diagnosis not present

## 2018-09-24 DIAGNOSIS — O358XX0 Maternal care for other (suspected) fetal abnormality and damage, not applicable or unspecified: Secondary | ICD-10-CM | POA: Diagnosis not present

## 2018-09-24 DIAGNOSIS — O359XX0 Maternal care for (suspected) fetal abnormality and damage, unspecified, not applicable or unspecified: Secondary | ICD-10-CM

## 2018-09-24 DIAGNOSIS — Z3A19 19 weeks gestation of pregnancy: Secondary | ICD-10-CM

## 2018-09-24 DIAGNOSIS — O09522 Supervision of elderly multigravida, second trimester: Secondary | ICD-10-CM

## 2018-09-24 DIAGNOSIS — Z362 Encounter for other antenatal screening follow-up: Secondary | ICD-10-CM | POA: Diagnosis not present

## 2018-10-02 ENCOUNTER — Other Ambulatory Visit (HOSPITAL_COMMUNITY): Payer: Self-pay

## 2018-10-11 ENCOUNTER — Encounter (HOSPITAL_COMMUNITY): Payer: Self-pay

## 2018-10-11 ENCOUNTER — Inpatient Hospital Stay (HOSPITAL_COMMUNITY)
Admission: AD | Admit: 2018-10-11 | Discharge: 2018-10-11 | Disposition: A | Discharge status: Home / Self Care | Payer: Medicaid Other | Source: Ambulatory Visit | Attending: Obstetrics & Gynecology | Admitting: Obstetrics & Gynecology

## 2018-10-11 ENCOUNTER — Other Ambulatory Visit: Payer: Self-pay

## 2018-10-11 DIAGNOSIS — J069 Acute upper respiratory infection, unspecified: Secondary | ICD-10-CM | POA: Diagnosis not present

## 2018-10-11 DIAGNOSIS — Z8249 Family history of ischemic heart disease and other diseases of the circulatory system: Secondary | ICD-10-CM | POA: Insufficient documentation

## 2018-10-11 DIAGNOSIS — Z3A22 22 weeks gestation of pregnancy: Secondary | ICD-10-CM | POA: Insufficient documentation

## 2018-10-11 DIAGNOSIS — R05 Cough: Secondary | ICD-10-CM | POA: Insufficient documentation

## 2018-10-11 DIAGNOSIS — O26892 Other specified pregnancy related conditions, second trimester: Secondary | ICD-10-CM | POA: Diagnosis not present

## 2018-10-11 MED ORDER — BENZONATATE 100 MG PO CAPS: 100.0000 mg | ORAL_CAPSULE | Freq: Three times a day (TID) | ORAL | 0 refills | Status: DC

## 2018-10-11 MED ORDER — AMOXICILLIN 500 MG PO CAPS: 500.0000 mg | ORAL_CAPSULE | Freq: Three times a day (TID) | ORAL | 0 refills | Status: DC

## 2018-10-11 NOTE — MAU Provider Note (Signed)
First Provider Initiated Contact with Patient 10/11/18 1027      Chief Complaint: cough   Dana Wade is  37 y.o. [id number] at 6153w2d presents complaining of cough and congestion for over a week.  She has now started coughing up yellow/green sputum and sinuses are stuffy. Throat is "a little" sore when she coughs. Feels like she is getting worse. No fever/body aches.  Obstetrical/Gynecological History:  OB History    Gravida  4   Para  2   Term  2   Preterm  0   AB  1   Living  2     SAB  1   TAB  0   Ectopic  0   Multiple  0   Live Births  2          Past Medical History: Past Medical History:  Diagnosis Date  . Infection    kidney infection 4-5 years ago    Past Surgical History: Past Surgical History:  Procedure Laterality Date  . DILATION AND EVACUATION N/A 12/31/2015   Procedure: DILATATION AND EVACUATION;  Surgeon: Tereso NewcomerUgonna A Anyanwu, MD;  Location: [facility];  Service: Gynecology;  Laterality: N/A;  . INCISION AND DRAINAGE POSTERIOR SACRAL SPINE  2006   fell while pregnant, had large hematoma    Family History: Family History  Problem Relation Age of Onset  . Hypertension Father     Social History: Social History   Tobacco Use  . Smoking status: Never Smoker  . Smokeless tobacco: Never Used  Substance Use Topics  . Alcohol use: No  . Drug use: No    Allergies: No Known Allergies  Meds:  Medications Prior to Admission  Medication Sig Dispense Refill Last Dose  . cephALEXin (KEFLEX) 500 MG capsule Take 1 capsule (500 mg total) by mouth 4 (four) times daily. (Patient not taking: Reported on 08/20/2018) 28 capsule 0 Not Taking  . Prenatal Vit-Fe Fumarate-FA (PRENATAL VITAMIN PO) Take by mouth.   Taking    Review of Systems   Constitutional: Negative for fever and chills Eyes: Negative for visual disturbances Respiratory: Negative for shortness of breath, dyspnea Cardiovascular: Negative for chest pain or palpitations   Gastrointestinal: Negative for vomiting, diarrhea and constipation Genitourinary: Negative for dysuria and urgency Musculoskeletal: Negative for back pain, joint pain, myalgias.  Normal ROM  Neurological: Negative for dizziness and headaches    Physical Exam  Blood pressure 104/71, pulse 93, temperature 98.4 F (36.9 C), temperature source Oral, resp. rate 18, weight 77 kg, last menstrual period 04/30/2018, SpO2 99 %, currently breastfeeding. GENERAL: Well-developed, well-nourished female in no acute distress. HEENT:  Frontal and maxillary sinuses sl tender; throat w/o erythema or exudate.  LUNGS: Normal respiratory effort.  LCTAB. Frequent coughing.  HEART: Regular rate and rhythm. ABDOMEN: Soft, nontender, nondistended, gravid. Marland Kitchen.+FHT via doppler EXTREMITIES: Nontender, no edema, 2+ distal pulses. DTR's 2+   Labs: No results found for this or any previous visit (from the past 24 hour(s)). Imaging Studies:    Assessment: Dana Wade is  37 y.o. [id number] at 9753w2d presents with URI/cough >10 days.  Plan:  rx amoxicillin and tessalon perles  Jacklyn ShellFrances Cresenzo-Dishmon 11/29/201910:39 AM

## 2018-10-11 NOTE — Discharge Instructions (Signed)
Infección de las vías aéreas superiores en los adultos.  Upper Respiratory Infection, Adult  La mayoría de las infecciones del tracto respiratorio superior son infecciones virales de las vías que llevan el aire a los pulmones. Un infección del tracto respiratorio superior afecta la nariz, la garganta y las vías respiratorias superiores. El tipo más frecuente de infección del tracto respiratorio superior es la nasofaringitis, que habitualmente se conoce como "resfrío común".  Las infecciones del tracto respiratorio superior siguen su curso y por lo general se curan solas. En la mayoría de los casos, la infección del tracto respiratorio superior no requiere atención médica, pero a veces, después de una infección viral, puede surgir una infección bacteriana en las vías respiratorias superiores. Esto se conoce como infección secundaria. Las infecciones sinusales y en el oído medio son tipos frecuentes de infecciones secundarias en el tracto respiratorio superior.  La neumonía bacteriana también puede complicar una infección del tracto respiratorio superior. Este tipo de infección puede empeorar el asma y la enfermedad pulmonar obstructiva crónica (EPOC). En algunos casos, estas complicaciones pueden requerir atención médica de emergencia y poner en peligro la vida.  ¿Cuáles son las causas?  Casi todas las infecciones del tracto respiratorio superior se deben a los virus. Un virus es un tipo de germen que puede contagiarse de una persona a otra.  ¿Qué incrementa el riesgo?  Puede estar en riesgo de sufrir una infección del tracto respiratorio superior si:  · Fuma.  · Tiene una enfermedad pulmonar o cardíaca crónica.  · Tiene debilitado el sistema de defensa (inmunitario) del cuerpo.  · Es muy joven o de edad muy avanzada.  · Tiene asma o alergias nasales.  · Trabaja en áreas donde hay mucha gente o poca ventilación.  · Trabaja en una escuela o en un centro de atención médica.    ¿Cuáles son los signos o los  síntomas?  Habitualmente, los síntomas aparecen de 2 a 3 días después de entrar en contacto con el virus del resfrío. La mayoría de las infecciones virales en el tracto respiratorio superior duran de 7 a 10 días. Sin embargo, las infecciones virales en el tracto respiratorio superior a causa del virus de la gripe pueden durar de 14 a 18 días y, habitualmente, son más graves. Entre los síntomas se pueden incluir los siguientes:  · Secreción o congestión nasal.  · Estornudos.  · Tos.  · Dolor de garganta.  · Dolor de cabeza.  · Fatiga.  · Fiebre.  · Pérdida del apetito.  · Dolor en la frente, detrás de los ojos y por encima de los pómulos (dolor sinusal).  · Dolores musculares.    ¿Cómo se diagnostica?  El médico puede diagnosticar una infección del tracto respiratorio superior mediante los siguientes estudios:  · Examen físico.  · Pruebas para verificar si los síntomas no se deben a otra afección, por ejemplo:  ? Faringitis estreptocócica.  ? Sinusitis.  ? Neumonía.  ? Asma.    ¿Cómo se trata?  Esta infección desaparece sola con el tiempo. No puede curarse con medicamentos, pero a menudo se prescriben para aliviar los síntomas. Los medicamentos pueden ser útiles para lo siguiente:  · Reducción de la fiebre.  · Reducción de la tos.  · Alivio de la congestión nasal.    Siga estas instrucciones en su casa:  · Tome los medicamentos solamente como se lo haya indicado el médico.  · A fin de aliviar el dolor de garganta, haga gárgaras con solución salina templada o consuma caramelos para la tos según   lo que le haya indicado el médico.  · Use un humidificador con vapor cálido o inhale el vapor de la ducha para aumentar la humedad del aire. Esto facilitará la respiración.  · Beba suficiente líquido para mantener la orina clara o de color amarillo pálido.  · Consuma sopas y otros caldos transparentes, y aliméntese bien.  · Descanse todo lo que sea necesario.  · Regrese al trabajo cuando la temperatura se le haya normalizado o  según lo que le indique el médico. Es posible que deba quedarse en su casa durante un tiempo prolongado, para no infectar a los demás. También puede usar un barbijo y lavarse las manos con cuidado para evitar la propagación del virus.  · Aumente el uso del inhalador si tiene asma.  · No consuma ningún producto que contenga tabaco, lo que incluye cigarrillos, tabaco de mascar o cigarrillos electrónicos. Si necesita ayuda para dejar de fumar, consulte al médico.  ¿Cómo se evita?  La mejor manera de protegerse de un resfrío es con la práctica de una higiene adecuada.  · Evite el contacto por vía oral o a través de las manos con personas que tengan síntomas de resfrío.  · En caso de contacto, lávese las manos con frecuencia.    No hay pruebas claras de que la vitamina C, la vitamina E, la equinácea o el ejercicio reduzcan la probabilidad de contraer un resfrío. Sin embargo, siempre se recomienda descansar mucho, hacer ejercicio y alimentarse bien.  Comuníquese con un médico si:  · Su estado empeora en lugar de mejorar.  · Los medicamentos no logran controlar los síntomas.  · Tiene escalofríos.  · Experimenta un empeoramiento en la falta de aire.  · Tiene mucosidad marrón o roja.  · Tiene secreción nasal amarilla o marrón.  · Le duele la cara, especialmente al inclinarse hacia adelante.  · Tiene fiebre.  · Tiene los ganglios del cuello hinchados.  · Siente dolor al tragar.  · Tiene zonas blancas en la parte de atrás de la garganta.  Solicite ayuda de inmediato si:  · Tiene síntomas intensos o persistentes de:  ? Dolor de cabeza.  ? Dolor de oído.  ? Dolor sinusal.  ? Dolor en el pecho.  · Tiene enfermedad pulmonar crónica y cualquiera de estos síntomas:  ? Sibilancias.  ? Tos prolongada.  ? Tos con sangre.  ? Cambio en la mucosidad habitual.  · Presenta rigidez en el cuello.  · Tiene cambios en:  ? La visión.  ? La audición.  ? El pensamiento.  ? El estado de ánimo.  Esta información no tiene como fin reemplazar el  consejo del médico. Asegúrese de hacerle al médico cualquier pregunta que tenga.  Document Released: 08/09/2005 Document Revised: 02/14/2017 Document Reviewed: 02/04/2014  Elsevier Interactive Patient Education © 2018 [employer]

## 2018-10-11 NOTE — MAU Note (Signed)
Had a cough for about a wk, cough is productive..  Having pain in back and chest.  When she coughs the pee is coming out. Denies fever, does have a little sore throat.

## 2018-11-19 ENCOUNTER — Other Ambulatory Visit (HOSPITAL_COMMUNITY): Payer: Self-pay | Admitting: *Deleted

## 2018-11-19 ENCOUNTER — Ambulatory Visit (HOSPITAL_COMMUNITY)
Admission: RE | Admit: 2018-11-19 | Discharge: 2018-11-19 | Disposition: A | Discharge status: Home / Self Care | Payer: Self-pay | Source: Ambulatory Visit | Attending: Family Medicine | Admitting: Family Medicine

## 2018-11-19 ENCOUNTER — Encounter (HOSPITAL_COMMUNITY): Payer: Self-pay

## 2018-11-19 DIAGNOSIS — O358XX0 Maternal care for other (suspected) fetal abnormality and damage, not applicable or unspecified: Secondary | ICD-10-CM

## 2018-11-19 DIAGNOSIS — O09522 Supervision of elderly multigravida, second trimester: Secondary | ICD-10-CM

## 2018-11-19 DIAGNOSIS — O09529 Supervision of elderly multigravida, unspecified trimester: Secondary | ICD-10-CM

## 2018-11-19 DIAGNOSIS — Z362 Encounter for other antenatal screening follow-up: Secondary | ICD-10-CM

## 2018-11-19 DIAGNOSIS — O359XX0 Maternal care for (suspected) fetal abnormality and damage, unspecified, not applicable or unspecified: Secondary | ICD-10-CM | POA: Insufficient documentation

## 2018-11-19 DIAGNOSIS — Z3A27 27 weeks gestation of pregnancy: Secondary | ICD-10-CM

## 2018-11-19 DIAGNOSIS — O4442 Low lying placenta NOS or without hemorrhage, second trimester: Secondary | ICD-10-CM

## 2018-12-17 ENCOUNTER — Encounter (HOSPITAL_COMMUNITY): Payer: Self-pay

## 2018-12-17 ENCOUNTER — Ambulatory Visit (HOSPITAL_COMMUNITY)
Admission: RE | Admit: 2018-12-17 | Discharge: 2018-12-17 | Disposition: A | Discharge status: Home / Self Care | Payer: Self-pay | Source: Ambulatory Visit | Attending: Obstetrics and Gynecology | Admitting: Obstetrics and Gynecology

## 2018-12-17 DIAGNOSIS — O09529 Supervision of elderly multigravida, unspecified trimester: Secondary | ICD-10-CM | POA: Insufficient documentation

## 2018-12-17 DIAGNOSIS — Z3A31 31 weeks gestation of pregnancy: Secondary | ICD-10-CM

## 2018-12-17 DIAGNOSIS — O4443 Low lying placenta NOS or without hemorrhage, third trimester: Secondary | ICD-10-CM

## 2018-12-17 DIAGNOSIS — O09523 Supervision of elderly multigravida, third trimester: Secondary | ICD-10-CM

## 2018-12-17 DIAGNOSIS — O358XX0 Maternal care for other (suspected) fetal abnormality and damage, not applicable or unspecified: Secondary | ICD-10-CM

## 2018-12-17 DIAGNOSIS — Z362 Encounter for other antenatal screening follow-up: Secondary | ICD-10-CM

## 2019-02-21 ENCOUNTER — Inpatient Hospital Stay (HOSPITAL_COMMUNITY): Payer: Medicaid Other | Admitting: Anesthesiology

## 2019-02-21 ENCOUNTER — Inpatient Hospital Stay (HOSPITAL_COMMUNITY)
Admission: AD | Admit: 2019-02-21 | Discharge: 2019-02-23 | DRG: 785 | Disposition: A | Discharge status: Home / Self Care | Payer: Medicaid Other | Attending: Obstetrics and Gynecology | Admitting: Obstetrics and Gynecology

## 2019-02-21 ENCOUNTER — Encounter (HOSPITAL_COMMUNITY)
Admission: AD | Disposition: A | Discharge status: Home / Self Care | Payer: Self-pay | Source: Home / Self Care | Attending: Obstetrics and Gynecology

## 2019-02-21 ENCOUNTER — Encounter (HOSPITAL_COMMUNITY): Payer: Self-pay | Admitting: Emergency Medicine

## 2019-02-21 ENCOUNTER — Other Ambulatory Visit: Payer: Self-pay

## 2019-02-21 DIAGNOSIS — Z8759 Personal history of other complications of pregnancy, childbirth and the puerperium: Secondary | ICD-10-CM

## 2019-02-21 DIAGNOSIS — O09529 Supervision of elderly multigravida, unspecified trimester: Secondary | ICD-10-CM

## 2019-02-21 DIAGNOSIS — O480 Post-term pregnancy: Principal | ICD-10-CM | POA: Diagnosis present

## 2019-02-21 DIAGNOSIS — Z302 Encounter for sterilization: Secondary | ICD-10-CM | POA: Diagnosis not present

## 2019-02-21 DIAGNOSIS — O321XX0 Maternal care for breech presentation, not applicable or unspecified: Secondary | ICD-10-CM | POA: Diagnosis present

## 2019-02-21 DIAGNOSIS — Z3009 Encounter for other general counseling and advice on contraception: Secondary | ICD-10-CM | POA: Diagnosis present

## 2019-02-21 DIAGNOSIS — Z3A41 41 weeks gestation of pregnancy: Secondary | ICD-10-CM

## 2019-02-21 LAB — CBC
HCT: 40.5 % (ref 36.0–46.0)
Hemoglobin: 13.8 g/dL (ref 12.0–15.0)
MCH: 30.9 pg (ref 26.0–34.0)
MCHC: 34.1 g/dL (ref 30.0–36.0)
MCV: 90.6 fL (ref 80.0–100.0)
Platelets: 237 10*3/uL (ref 150–400)
RBC: 4.47 MIL/uL (ref 3.87–5.11)
RDW: 13.1 % (ref 11.5–15.5)
WBC: 8.2 10*3/uL (ref 4.0–10.5)
nRBC: 0 % (ref 0.0–0.2)

## 2019-02-21 LAB — TYPE AND SCREEN
ABO/RH(D): O POS
Antibody Screen: NEGATIVE

## 2019-02-21 LAB — ABO/RH: ABO/RH(D): O POS

## 2019-02-21 SURGERY — Surgical Case
Anesthesia: Regional | Site: Abdomen | Wound class: Clean Contaminated

## 2019-02-21 MED ORDER — ACETAMINOPHEN 325 MG PO TABS: 650.0000 mg | ORAL_TABLET | ORAL | Status: DC | PRN

## 2019-02-21 MED ORDER — FENTANYL CITRATE (PF) 100 MCG/2ML IJ SOLN: 25.0000 ug | INTRAMUSCULAR | Status: DC | PRN

## 2019-02-21 MED ORDER — OXYCODONE HCL 5 MG PO TABS
ORAL_TABLET | ORAL | Status: AC
  Filled 2019-02-21: qty 1

## 2019-02-21 MED ORDER — MORPHINE SULFATE (PF) 0.5 MG/ML IJ SOLN
INTRAMUSCULAR | Status: AC
  Filled 2019-02-21: qty 10

## 2019-02-21 MED ORDER — OXYCODONE HCL 5 MG/5ML PO SOLN: 5.0000 mg | Freq: Once | ORAL | Status: AC | PRN

## 2019-02-21 MED ORDER — WITCH HAZEL-GLYCERIN EX PADS: 1.0000 "application " | MEDICATED_PAD | CUTANEOUS | Status: DC | PRN

## 2019-02-21 MED ORDER — IBUPROFEN 800 MG PO TABS
800.0000 mg | ORAL_TABLET | Freq: Three times a day (TID) | ORAL | Status: DC
  Administered 2019-02-21 – 2019-02-23 (×5): 800 mg via ORAL
  Filled 2019-02-21 (×5): qty 1

## 2019-02-21 MED ORDER — SIMETHICONE 80 MG PO CHEW
80.0000 mg | CHEWABLE_TABLET | Freq: Three times a day (TID) | ORAL | Status: DC
  Administered 2019-02-22 – 2019-02-23 (×5): 80 mg via ORAL
  Filled 2019-02-21 (×5): qty 1

## 2019-02-21 MED ORDER — SCOPOLAMINE 1 MG/3DAYS TD PT72
MEDICATED_PATCH | TRANSDERMAL | Status: AC
  Filled 2019-02-21: qty 1

## 2019-02-21 MED ORDER — COCONUT OIL OIL: 1.0000 "application " | TOPICAL_OIL | Status: DC | PRN

## 2019-02-21 MED ORDER — LACTATED RINGERS IV SOLN
INTRAVENOUS | Status: DC
  Administered 2019-02-22 (×2): via INTRAVENOUS

## 2019-02-21 MED ORDER — NALBUPHINE HCL 10 MG/ML IJ SOLN: 5.0000 mg | INTRAMUSCULAR | Status: DC | PRN

## 2019-02-21 MED ORDER — SOD CITRATE-CITRIC ACID 500-334 MG/5ML PO SOLN: 30.0000 mL | ORAL | Status: DC

## 2019-02-21 MED ORDER — PHENYLEPHRINE HCL-NACL 20-0.9 MG/250ML-% IV SOLN
INTRAVENOUS | Status: AC
  Filled 2019-02-21: qty 250

## 2019-02-21 MED ORDER — PRENATAL MULTIVITAMIN CH
1.0000 | ORAL_TABLET | Freq: Every day | ORAL | Status: DC
  Administered 2019-02-22 – 2019-02-23 (×2): 1 via ORAL
  Filled 2019-02-21 (×2): qty 1

## 2019-02-21 MED ORDER — NALOXONE HCL 0.4 MG/ML IJ SOLN: 0.4000 mg | INTRAMUSCULAR | Status: DC | PRN

## 2019-02-21 MED ORDER — ONDANSETRON HCL 4 MG/2ML IJ SOLN
INTRAMUSCULAR | Status: AC
  Filled 2019-02-21: qty 2

## 2019-02-21 MED ORDER — ONDANSETRON HCL 4 MG/2ML IJ SOLN: 4.0000 mg | Freq: Three times a day (TID) | INTRAMUSCULAR | Status: DC | PRN

## 2019-02-21 MED ORDER — DIPHENHYDRAMINE HCL 25 MG PO CAPS: 25.0000 mg | ORAL_CAPSULE | ORAL | Status: DC | PRN

## 2019-02-21 MED ORDER — OXYTOCIN 40 UNITS IN NORMAL SALINE INFUSION - SIMPLE MED: 2.5000 [IU]/h | INTRAVENOUS | Status: DC

## 2019-02-21 MED ORDER — ONDANSETRON HCL 4 MG/2ML IJ SOLN
INTRAMUSCULAR | Status: DC | PRN
  Administered 2019-02-21: 4 mg via INTRAVENOUS

## 2019-02-21 MED ORDER — ONDANSETRON HCL 4 MG/2ML IJ SOLN: 4.0000 mg | Freq: Four times a day (QID) | INTRAMUSCULAR | Status: DC | PRN

## 2019-02-21 MED ORDER — TERBUTALINE SULFATE 1 MG/ML IJ SOLN
INTRAMUSCULAR | Status: AC
  Filled 2019-02-21: qty 1

## 2019-02-21 MED ORDER — SODIUM CHLORIDE 0.9 % IR SOLN
Status: DC | PRN
  Administered 2019-02-21: 1

## 2019-02-21 MED ORDER — SOD CITRATE-CITRIC ACID 500-334 MG/5ML PO SOLN
30.0000 mL | ORAL | Status: DC | PRN
  Administered 2019-02-21: 16:00:00 30 mL via ORAL
  Filled 2019-02-21: qty 30

## 2019-02-21 MED ORDER — NALBUPHINE HCL 10 MG/ML IJ SOLN: 5.0000 mg | Freq: Once | INTRAMUSCULAR | Status: DC | PRN

## 2019-02-21 MED ORDER — OXYCODONE HCL 5 MG PO TABS: 5.0000 mg | ORAL_TABLET | ORAL | Status: DC | PRN

## 2019-02-21 MED ORDER — SODIUM CHLORIDE 0.9% FLUSH: 3.0000 mL | INTRAVENOUS | Status: DC | PRN

## 2019-02-21 MED ORDER — SCOPOLAMINE 1 MG/3DAYS TD PT72: 1.0000 | MEDICATED_PATCH | Freq: Once | TRANSDERMAL | Status: DC

## 2019-02-21 MED ORDER — ENOXAPARIN SODIUM 40 MG/0.4ML ~~LOC~~ SOLN
40.0000 mg | SUBCUTANEOUS | Status: DC
  Administered 2019-02-22: 21:00:00 40 mg via SUBCUTANEOUS
  Filled 2019-02-21: qty 0.4

## 2019-02-21 MED ORDER — SODIUM CHLORIDE 0.9 % IV SOLN
INTRAVENOUS | Status: AC
  Filled 2019-02-21: qty 500

## 2019-02-21 MED ORDER — LACTATED RINGERS IV SOLN: 500.0000 mL | INTRAVENOUS | Status: DC | PRN

## 2019-02-21 MED ORDER — MENTHOL 3 MG MT LOZG: 1.0000 | LOZENGE | OROMUCOSAL | Status: DC | PRN

## 2019-02-21 MED ORDER — NALOXONE HCL 4 MG/10ML IJ SOLN
1.0000 ug/kg/h | INTRAVENOUS | Status: DC | PRN
  Filled 2019-02-21: qty 5

## 2019-02-21 MED ORDER — TETANUS-DIPHTH-ACELL PERTUSSIS 5-2.5-18.5 LF-MCG/0.5 IM SUSP
0.5000 mL | Freq: Once | INTRAMUSCULAR | Status: DC
  Filled 2019-02-21: qty 0.5

## 2019-02-21 MED ORDER — SODIUM CHLORIDE 0.9 % IV SOLN
INTRAVENOUS | Status: DC | PRN
  Administered 2019-02-21: 60 ug/min via INTRAVENOUS

## 2019-02-21 MED ORDER — FENTANYL CITRATE (PF) 100 MCG/2ML IJ SOLN
INTRAMUSCULAR | Status: AC
  Filled 2019-02-21: qty 2

## 2019-02-21 MED ORDER — TERBUTALINE SULFATE 1 MG/ML IJ SOLN
0.2500 mg | Freq: Once | INTRAMUSCULAR | Status: AC
  Administered 2019-02-21: 0.25 mg via SUBCUTANEOUS

## 2019-02-21 MED ORDER — DIPHENHYDRAMINE HCL 25 MG PO CAPS: 25.0000 mg | ORAL_CAPSULE | Freq: Four times a day (QID) | ORAL | Status: DC | PRN

## 2019-02-21 MED ORDER — DIPHENHYDRAMINE HCL 50 MG/ML IJ SOLN: 12.5000 mg | INTRAMUSCULAR | Status: DC | PRN

## 2019-02-21 MED ORDER — ONDANSETRON HCL 4 MG/2ML IJ SOLN: 4.0000 mg | Freq: Once | INTRAMUSCULAR | Status: DC | PRN

## 2019-02-21 MED ORDER — MEPERIDINE HCL 25 MG/ML IJ SOLN: 6.2500 mg | INTRAMUSCULAR | Status: DC | PRN

## 2019-02-21 MED ORDER — OXYCODONE HCL 5 MG PO TABS
5.0000 mg | ORAL_TABLET | Freq: Once | ORAL | Status: AC | PRN
  Administered 2019-02-21: 18:00:00 5 mg via ORAL

## 2019-02-21 MED ORDER — SENNOSIDES-DOCUSATE SODIUM 8.6-50 MG PO TABS: 2.0000 | ORAL_TABLET | Freq: Every evening | ORAL | Status: DC | PRN

## 2019-02-21 MED ORDER — STERILE WATER FOR IRRIGATION IR SOLN
Status: DC | PRN
  Administered 2019-02-21: 1

## 2019-02-21 MED ORDER — FENTANYL CITRATE (PF) 100 MCG/2ML IJ SOLN: 100.0000 ug | INTRAMUSCULAR | Status: DC | PRN

## 2019-02-21 MED ORDER — POLYETHYLENE GLYCOL 3350 17 G PO PACK
17.0000 g | PACK | Freq: Every day | ORAL | Status: DC
  Administered 2019-02-22 – 2019-02-23 (×2): 17 g via ORAL
  Filled 2019-02-21 (×2): qty 1

## 2019-02-21 MED ORDER — LIDOCAINE HCL (PF) 1 % IJ SOLN
30.0000 mL | INTRAMUSCULAR | Status: DC | PRN
  Filled 2019-02-21: qty 30

## 2019-02-21 MED ORDER — OXYTOCIN BOLUS FROM INFUSION: 500.0000 mL | Freq: Once | INTRAVENOUS | Status: DC

## 2019-02-21 MED ORDER — ACETAMINOPHEN 500 MG PO TABS
1000.0000 mg | ORAL_TABLET | Freq: Three times a day (TID) | ORAL | Status: DC
  Administered 2019-02-21 – 2019-02-23 (×6): 1000 mg via ORAL
  Filled 2019-02-21 (×6): qty 2

## 2019-02-21 MED ORDER — SODIUM CHLORIDE 0.9 % IV SOLN
INTRAVENOUS | Status: DC | PRN
  Administered 2019-02-21: 40 [IU] via INTRAVENOUS

## 2019-02-21 MED ORDER — SCOPOLAMINE 1 MG/3DAYS TD PT72
MEDICATED_PATCH | TRANSDERMAL | Status: DC | PRN
  Administered 2019-02-21: 1 via TRANSDERMAL

## 2019-02-21 MED ORDER — OXYTOCIN 40 UNITS IN NORMAL SALINE INFUSION - SIMPLE MED
INTRAVENOUS | Status: AC
  Filled 2019-02-21: qty 1000

## 2019-02-21 MED ORDER — OXYTOCIN 40 UNITS IN NORMAL SALINE INFUSION - SIMPLE MED: 2.5000 [IU]/h | INTRAVENOUS | Status: AC

## 2019-02-21 MED ORDER — DIBUCAINE (PERIANAL) 1 % EX OINT: 1.0000 "application " | TOPICAL_OINTMENT | CUTANEOUS | Status: DC | PRN

## 2019-02-21 MED ORDER — LACTATED RINGERS IV SOLN
INTRAVENOUS | Status: DC | PRN
  Administered 2019-02-21: 16:00:00 via INTRAVENOUS

## 2019-02-21 MED ORDER — SODIUM CHLORIDE 0.9 % IV SOLN
INTRAVENOUS | Status: DC | PRN
  Administered 2019-02-21: 500 mg via INTRAVENOUS

## 2019-02-21 MED ORDER — CEFAZOLIN SODIUM-DEXTROSE 2-4 GM/100ML-% IV SOLN
2.0000 g | INTRAVENOUS | Status: AC
  Administered 2019-02-21: 2 g via INTRAVENOUS

## 2019-02-21 MED ORDER — LACTATED RINGERS IV SOLN
INTRAVENOUS | Status: DC
  Administered 2019-02-21 (×2): via INTRAVENOUS

## 2019-02-21 SURGICAL SUPPLY — 39 items
APL SKNCLS STERI-STRIP NONHPOA (GAUZE/BANDAGES/DRESSINGS) ×1
BENZOIN TINCTURE PRP APPL 2/3 (GAUZE/BANDAGES/DRESSINGS) ×3 IMPLANT
CANISTER SUCT 3000ML PPV (MISCELLANEOUS) ×3 IMPLANT
CHLORAPREP W/TINT 26ML (MISCELLANEOUS) ×3 IMPLANT
CLOSURE STERI STRIP 1/2 X4 (GAUZE/BANDAGES/DRESSINGS) ×2 IMPLANT
DRSG OPSITE POSTOP 4X10 (GAUZE/BANDAGES/DRESSINGS) ×3 IMPLANT
ELECT REM PT RETURN 9FT ADLT (ELECTROSURGICAL) ×3
ELECTRODE REM PT RTRN 9FT ADLT (ELECTROSURGICAL) ×1 IMPLANT
EXTRACTOR VACUUM KIWI (MISCELLANEOUS) ×3 IMPLANT
GAUZE SPONGE 4X4 12PLY STRL LF (GAUZE/BANDAGES/DRESSINGS) ×4 IMPLANT
GLOVE BIOGEL PI IND STRL 7.0 (GLOVE) ×2 IMPLANT
GLOVE BIOGEL PI IND STRL 7.5 (GLOVE) ×1 IMPLANT
GLOVE BIOGEL PI INDICATOR 7.0 (GLOVE) ×4
GLOVE BIOGEL PI INDICATOR 7.5 (GLOVE) ×2
GLOVE SKINSENSE NS SZ7.0 (GLOVE) ×2
GLOVE SKINSENSE STRL SZ7.0 (GLOVE) ×1 IMPLANT
GOWN STRL REUS W/ TWL LRG LVL3 (GOWN DISPOSABLE) ×2 IMPLANT
GOWN STRL REUS W/ TWL XL LVL3 (GOWN DISPOSABLE) ×1 IMPLANT
GOWN STRL REUS W/TWL LRG LVL3 (GOWN DISPOSABLE) ×6
GOWN STRL REUS W/TWL XL LVL3 (GOWN DISPOSABLE) ×3
NS IRRIG 1000ML POUR BTL (IV SOLUTION) ×3 IMPLANT
PACK C SECTION WH (CUSTOM PROCEDURE TRAY) ×3 IMPLANT
PAD ABD 7.5X8 STRL (GAUZE/BANDAGES/DRESSINGS) ×3 IMPLANT
PAD OB MATERNITY 4.3X12.25 (PERSONAL CARE ITEMS) ×3 IMPLANT
PAD PREP 24X48 CUFFED NSTRL (MISCELLANEOUS) ×3 IMPLANT
PENCIL SMOKE EVAC W/HOLSTER (ELECTROSURGICAL) ×3 IMPLANT
SPONGE LAP 18X18 X RAY DECT (DISPOSABLE) ×2 IMPLANT
STRIP CLOSURE SKIN 1/2X4 (GAUZE/BANDAGES/DRESSINGS) ×2 IMPLANT
SUT MNCRL 0 VIOLET CTX 36 (SUTURE) ×2 IMPLANT
SUT MON AB 4-0 PS1 27 (SUTURE) ×3 IMPLANT
SUT MONOCRYL 0 CTX 36 (SUTURE) ×4
SUT PLAIN 0 NONE (SUTURE) ×4 IMPLANT
SUT PLAIN 2 0 XLH (SUTURE) ×5 IMPLANT
SUT VIC AB 0 CT1 36 (SUTURE) ×6 IMPLANT
SUT VIC AB 3-0 CT1 27 (SUTURE) ×3
SUT VIC AB 3-0 CT1 TAPERPNT 27 (SUTURE) ×1 IMPLANT
SUT VIC AB 4-0 KS 27 (SUTURE) ×2 IMPLANT
TOWEL OR 17X24 6PK STRL BLUE (TOWEL DISPOSABLE) ×6 IMPLANT
WATER STERILE IRR 1000ML POUR (IV SOLUTION) ×3 IMPLANT

## 2019-02-21 NOTE — OB Triage Provider Note (Addendum)
Pt informed that the ultrasound is considered a limited OB ultrasound and is not intended to be a complete ultrasound exam.  Patient also informed that the ultrasound is not being completed with the intent of assessing for fetal or placental anomalies or any pelvic abnormalities.  Explained that the purpose of today's ultrasound is to assess for  presentation.  Patient acknowledges the purpose of the exam and the limitations of the study.   RN asked for Bedside US due bag of water palpated with cervical exam. Vertex position with fetal head toward maternal right.   Duane Lope, NP 02/21/2019

## 2019-02-21 NOTE — Transfer of Care (Signed)
Immediate Anesthesia Transfer of Care Note  Patient: Dana Wade  Procedure(s) Performed: CESAREAN SECTION (N/A Abdomen)  Patient Location: PACU  Anesthesia Type:Spinal  Level of Consciousness: awake, alert  and oriented  Airway & Oxygen Therapy: Patient Spontanous Breathing  Post-op Assessment: Report given to RN and Post -op Vital signs reviewed and stable  Post vital signs: Reviewed and stable  Last Vitals:  Vitals Value Taken Time  BP 93/44 02/21/2019  5:15 PM  Temp    Pulse 91 02/21/2019  5:17 PM  Resp 22 02/21/2019  5:17 PM  SpO2 99 % 02/21/2019  5:17 PM  Vitals shown include unvalidated device data.  Last Pain:  Vitals:   02/21/19 0840  PainSc: 5          Complications: No apparent anesthesia complications

## 2019-02-21 NOTE — Op Note (Signed)
Operative Note   SURGERY DATE: 02/21/2019  PRE-OP DIAGNOSIS:  *Pregnancy at 41/2 *Complete breech *Active labor *Moderate meconium *Desire for permanent sterilization  POST-OP DIAGNOSIS: Same. delivered   PROCEDURE: primary low transverse cesarean section via pfannenstiel skin incision with double layer uterine closure and bilateral tubal ligation via parkland method  SURGEON: Surgeon(s) and Role:    * Kirkman BingPickens, Sameria Morss, MD - Primary  ASSISTANT:    Gwenevere Abbot* Phillip, Nimeka, MD - Fellow  ANESTHESIA: epidural  ESTIMATED BLOOD LOSS: 359mL  DRAINS: 500mL UOP via indwelling foley  TOTAL IV FLUIDS: 2000mL crystalloid  VTE PROPHYLAXIS: SCDs to bilateral lower extremities  ANTIBIOTICS: Two grams of Cefazolin were given., within 1 hour of skin incision and azithromycin 500mg  IV x 1 given during the case  SPECIMENS: portions of bilateral tubes to pathology  COMPLICATIONS: none  FINDINGS: No intra-abdominal adhesions were noted. Grossly normal uterus, tubes and ovaries. Moderate meconium stained amniotic fluid, complete breech, female infant, weight 3490gm, APGARs 8/9, intact placenta.  PROCEDURE IN DETAIL: The patient was taken to the operating room where anesthesia was administered and normal fetal heart tones were confirmed. She was then prepped and draped in the normal fashion in the dorsal supine position with a leftward tilt.  After a time out was performed, a pfannensteil  skin incision was made with the scalpel and carried through to the underlying layer of fascia. The fascia was then incised at the midline and this incision was extended laterally with the mayo scissors. Attention was turned to the superior aspect of the fascial incision which was grasped with the kocher clamps x 2, tented up and the rectus muscles were dissected off with the bovie. In a similar fashion the inferior aspect of the fascial incision was grasped with the kocher clamps, tented up and the rectus muscles dissected  off with the mayo scissors. The rectus muscles were then separated in the midline and the peritoneum was entered bluntly. The bladder blade was inserted and the vesicouterine peritoneum was identified, tented up and entered with the metzenbaum scissors. This incision was extended laterally and the bladder flap was created digitally. The bladder blade was reinserted.  A low transverse hysterotomy was made with the scalpel until the endometrial cavity was breached and the amniotic sac ruptured , yielding meconium stained amniotic fluid. This incision was extended bluntly and the infant's head, shoulders and body were delivered atraumatically.The cord was clamped x 2 and cut, and the infant was handed to the awaiting pediatricians, after delayed cord clamping was not done.  The placenta was then gradually expressed from the uterus and then the uterus was exteriorized and cleared of all clots and debris. The hysterotomy was repaired with a running suture of 1-0 monocryl. A second imbricating layer of 1-0 monocryl suture was then placed  to achieve excellent hemostasis.   The left Fallopian tube was identified by tracing out to the fimbraie, grasped with the Babcock clamps. An avascular midsection of the tube approximately 3-4cm from the cornua was grasped with the babcock clamps and the distal and proximal aspects were doubly ligated with a suture of 2-0 plain gut, with the intervening portion of tube was transected and removed, via the Metzenbaum scissors. Another suture tie was then placed below both stumps.  Attention was then turned to the right fallopian tube after confirmation by tracing the tube out to the fimbriae. The same procedure was then performed on the right Fallopian tube, with excellent hemostasis was noted from both BTL sites.  The uterus and adnexa were then returned to the abdomen, and the hysterotomy and all operative sites were reinspected and excellent hemostasis was noted after irrigation  and suction of the abdomen with warm saline; both BTL sites intact and hemostatic  The peritoneum was closed with a running stitch of 3-0 Vicryl. The fascia was reapproximated with 0 Vicryl in a simple running fashion bilaterally. The subcutaneous layer was then reapproximated with interrupted sutures of 2-0 plain gut, and the skin was then closed with 4-0 monocryl, in a subcuticular fashion.  The patient  tolerated the procedure well. Sponge, lap, needle, and instrument counts were correct x 2. The patient was transferred to the recovery room awake, alert and breathing independently in stable condition.  Cornelia Copa MD Attending Center for [facility])

## 2019-02-21 NOTE — Discharge Summary (Addendum)
Postpartum Discharge Summary     Patient Name: Dana Wade DOB: 1981-07-28 MRN: [id number]  Date of admission: 02/21/2019 Delivering Provider:     Date of discharge: 02/23/2019  Admitting diagnosis: 40WKS CTX,BLEEDING Intrauterine pregnancy: [redacted]w[redacted]d     Secondary diagnosis:  Active Problems:   Advanced maternal age in multigravida   Personal history of previous postdates pregnancy   Unwanted fertility   Breech presentation delivered   Cesarean delivery delivered  Additional problems: none     Discharge diagnosis: Term Pregnancy Delivered                                                                                                Post partum procedures:postpartum tubal ligation  Augmentation: AROM  Complications: None  Hospital course:  Onset of Labor With Unplanned C/S  38 y.o. yo [id number] at [redacted]w[redacted]d was admitted in Active Labor on 02/21/2019. Patient had a labor course significant for breech presentation noted while in labor Membrane Rupture Time/Date: 2:50 PM ,02/21/2019   The patient went for cesarean section due to Malpresentation, and delivered a Viable infant,02/21/2019  Details of operation can be found in separate operative note. Patient had an uncomplicated postpartum course. She uses ibuprofen for most pain , will receive 15 vicodin.  She is ambulating,tolerating a regular diet, passing flatus, and urinating well.  Patient is discharged home in stable condition 02/23/19.  Magnesium Sulfate recieved: No BMZ received: No  Physical exam  Vitals:   02/22/19 0644 02/22/19 1021 02/22/19 2157 02/23/19 0540  BP: (!) 74/49 (!) 92/58 (!) 99/53 (!) 93/58  Pulse: (!) 58 70 64 (!) 58  Resp:  16 16   Temp:  98.2 F (36.8 C) 98.5 F (36.9 C) 98.4 F (36.9 C)  TempSrc:  Oral Oral   SpO2: 98% 96% 97% 95%  Weight:       General: alert, cooperative and no distress Lochia: appropriate Uterine Fundus: firm Incision: Healing well with no significant drainage, No  significant erythema, Dressing is clean, dry, and intact DVT Evaluation: No evidence of DVT seen on physical exam. Labs: Lab Results  Component Value Date   WBC 8.2 02/21/2019   HGB 13.8 02/21/2019   HCT 40.5 02/21/2019   MCV 90.6 02/21/2019   PLT 237 02/21/2019   CMP Latest Ref Rng & Units 06/19/2016  Glucose 65 - 99 mg/dL 84  BUN 6 - 20 mg/dL 9  Creatinine 8.56 - 3.14 mg/dL 9.70  Sodium 263 - 785 mmol/L 136  Potassium 3.5 - 5.1 mmol/L 3.8  Chloride 101 - 111 mmol/L 106  CO2 22 - 32 mmol/L 21(L)  Calcium 8.9 - 10.3 mg/dL 9.8  Total Protein 6.5 - 8.1 g/dL 6.6  Total Bilirubin 0.3 - 1.2 mg/dL 0.4  Alkaline Phos 38 - 126 U/L 45  AST 15 - 41 U/L 17  ALT 14 - 54 U/L 18    Discharge instruction: per After Visit Summary and "Baby and Me Booklet".  After visit meds:  Allergies as of 02/23/2019   No Known Allergies     Medication List    STOP taking  these medications   cephALEXin 500 MG capsule Commonly known as:  KEFLEX     TAKE these medications   HYDROcodone-acetaminophen 5-325 MG tablet Commonly known as:  NORCO/VICODIN Take 1 tablet by mouth every 6 (six) hours as needed for moderate pain. May take with ibuprofen   ibuprofen 800 MG tablet Commonly known as:  ADVIL,MOTRIN Take 1 tablet (800 mg total) by mouth every 8 (eight) hours.   PRENATAL VITAMIN PO Take by mouth.       Diet: routine diet  Activity: Advance as tolerated. Pelvic rest for 6 weeks.   Outpatient follow up:2 weeks Follow up Appt:No future appointments. Follow up Visit: Follow-up Information    [facility]   Contact information: [address] [place] [address] [phone]          Please schedule this patient for Postpartum visit in: 2 weeks with the following provider: Any provider For C/S patients schedule nurse incision check in weeks 2 weeks: yes Low risk pregnancy complicated by: AMA, postdates Delivery mode:   CS Anticipated Birth Control:  BTL done PP PP Procedures needed: Incision check  Schedule Integrated BH visit: no   Newborn Data: Live born female  Birth Weight:   APGAR: ,   Newborn Delivery   Birth date/time:  02/21/2019 16:21:00 Delivery type:  C-Section, Low Transverse Trial of labor:  Yes C-section categorization:  Primary     Baby Feeding: Bottle and Breast Disposition:home with mother   02/23/2019 Tilda BurrowJohn V Emmersyn Kratzke, MD

## 2019-02-21 NOTE — Progress Notes (Signed)
LABOR PROGRESS NOTE  Dana Wade is a 38 y.o. [id number] at [redacted]w[redacted]d  admitted for SOL  Subjective: Feeling more intense pain with contractions, but managing very well   Objective: BP 114/69   Pulse 75   Wt 80.5 kg   LMP 04/30/2018   BMI 33.00 kg/m  or  Vitals:   02/21/19 0841 02/21/19 0852 02/21/19 1225  BP:  125/81 114/69  Pulse:   75  Weight: 80.5 kg      Dilation: 9 Effacement (%): 60 Cervical Position: Anterior Station: -2 Presentation: Vertex Exam by:: Oni Dietzman FHT: baseline rate 130, moderate varibility, + acel, no decel Toco: regular q 3-46m   Labs: Lab Results  Component Value Date   WBC 8.2 02/21/2019   HGB 13.8 02/21/2019   HCT 40.5 02/21/2019   MCV 90.6 02/21/2019   PLT 237 02/21/2019    Patient Active Problem List   Diagnosis Date Noted  . Personal history of previous postdates pregnancy 02/21/2019  . Language barrier 01/09/2017    Assessment / Plan: 38 y.o. [id number] at [redacted]w[redacted]d here for SOL  Labor: AROM now with particulate meconium  Fetal Wellbeing:  Cat 1 Pain Control:  Managing well without medication  Anticipated MOD:  SVD  Zakai Gonyea,MD OB Fellow  02/21/2019, 2:54 PM

## 2019-02-21 NOTE — Anesthesia Procedure Notes (Signed)
Spinal  Patient location during procedure: OR Staffing Anesthesiologist: Candiace West E, MD Performed: anesthesiologist  Preanesthetic Checklist Completed: patient identified, surgical consent, pre-op evaluation, timeout performed, IV checked, risks and benefits discussed and monitors and equipment checked Spinal Block Patient position: sitting Prep: site prepped and draped and DuraPrep Patient monitoring: continuous pulse ox, blood pressure and heart rate Approach: midline Location: L3-4 Injection technique: single-shot Needle Needle type: Pencan  Needle gauge: 24 G Needle length: 9 cm Additional Notes Functioning IV was confirmed and monitors were applied. Sterile prep and drape, including hand hygiene and sterile gloves were used. The patient was positioned and the spine was prepped. The skin was anesthetized with lidocaine.  Free flow of clear CSF was obtained prior to injecting local anesthetic into the CSF. The needle was carefully withdrawn. The patient tolerated the procedure well.      

## 2019-02-21 NOTE — Anesthesia Preprocedure Evaluation (Signed)
Anesthesia Evaluation  Patient identified by MRN, date of birth, ID band Patient awake    Reviewed: Allergy & Precautions, H&P , NPO status , Patient's Chart, lab work & pertinent test results  History of Anesthesia Complications Negative for: history of anesthetic complications  Airway Mallampati: II  TM Distance: >3 FB Neck ROM: full    Dental no notable dental hx.    Pulmonary neg pulmonary ROS,    Pulmonary exam normal        Cardiovascular negative cardio ROS Normal cardiovascular exam     Neuro/Psych negative neurological ROS  negative psych ROS   GI/Hepatic negative GI ROS, Neg liver ROS,   Endo/Other  negative endocrine ROS  Renal/GU negative Renal ROS  negative genitourinary   Musculoskeletal   Abdominal   Peds  Hematology negative hematology ROS (+)   Anesthesia Other Findings   Reproductive/Obstetrics (+) Pregnancy                             Anesthesia Physical Anesthesia Plan  ASA: II  Anesthesia Plan: Spinal   Post-op Pain Management:    Induction:   PONV Risk Score and Plan: Ondansetron and Treatment may vary due to age or medical condition  Airway Management Planned:   Additional Equipment:   Intra-op Plan:   Post-operative Plan:   Informed Consent: I have reviewed the patients History and Physical, chart, labs and discussed the procedure including the risks, benefits and alternatives for the proposed anesthesia with the patient or authorized representative who has indicated his/her understanding and acceptance.       Plan Discussed with:   Anesthesia Plan Comments:         Anesthesia Quick Evaluation  

## 2019-02-21 NOTE — H&P (Signed)
LABOR AND DELIVERY ADMISSION HISTORY AND PHYSICAL NOTE  Dana Wade is a 38 y.o. female [phone] with IUP at 6079w2d by 6w US presenting for SOL.  She reports positive fetal movement. She denies leakage of fluid or vaginal bleeding.  Prenatal History/Complications: PNC at HD Pregnancy complications:  - low lying placenta (resolved) - AMA  Past Medical History: Past Medical History:  Diagnosis Date  . Infection    kidney infection 4-5 years ago    Past Surgical History: Past Surgical History:  Procedure Laterality Date  . DILATION AND EVACUATION N/A 12/31/2015   Procedure: DILATATION AND EVACUATION;  Surgeon: Tereso NewcomerUgonna A Anyanwu, MD;  Location: [facility];  Service: Gynecology;  Laterality: N/A;  . INCISION AND DRAINAGE POSTERIOR SACRAL SPINE  2006   fell while pregnant, had large hematoma    Obstetrical History: OB History    Gravida  4   Para  2   Term  2   Preterm  0   AB  1   Living  2     SAB  1   TAB  0   Ectopic  0   Multiple  0   Live Births  2           Social History: Social History   Socioeconomic History  . Marital status: Single    Spouse name: Not on file  . Number of children: Not on file  . Years of education: Not on file  . Highest education level: Not on file  Occupational History  . Not on file  Social Needs  . Financial resource strain: Not on file  . Food insecurity:    Worry: Not on file    Inability: Not on file  . Transportation needs:    Medical: Not on file    Non-medical: Not on file  Tobacco Use  . Smoking status: Never Smoker  . Smokeless tobacco: Never Used  Substance and Sexual Activity  . Alcohol use: No  . Drug use: No  . Sexual activity: Yes    Birth control/protection: None  Lifestyle  . Physical activity:    Days per week: Not on file    Minutes per session: Not on file  . Stress: Not on file  Relationships  . Social connections:    Talks on phone: Not on file    Gets together: Not on file    Attends religious service: Not on file    Active member of club or organization: Not on file    Attends meetings of clubs or organizations: Not on file    Relationship status: Not on file  Other Topics Concern  . Not on file  Social History Narrative  . Not on file    Family History: Family History  Problem Relation Age of Onset  . Hypertension Father     Allergies: No Known Allergies  Medications Prior to Admission  Medication Sig Dispense Refill Last Dose  . Prenatal Vit-Fe Fumarate-FA (PRENATAL VITAMIN PO) Take by mouth.   02/20/2019 at Unknown time  . cephALEXin (KEFLEX) 500 MG capsule Take 1 capsule (500 mg total) by mouth 4 (four) times daily. (Patient not taking: Reported on 08/20/2018) 28 capsule 0 Not Taking     Review of Systems  All systems reviewed and negative except as stated in HPI  Physical Exam Blood pressure 125/81, weight 80.5 kg, last menstrual period 04/30/2018, currently breastfeeding. General appearance: alert, oriented, NAD Lungs: normal respiratory effort Heart: regular rate Abdomen: soft, non-tender;  gravid, FH appropriate for GA Extremities: No calf swelling or tenderness Presentation: cephalic Fetal monitoring: baseline 130/mod var/+ acels/no decels Uterine activity: regular q3-70m Dilation: 5 Effacement (%): 50 Station: -2 Exam by:: Everlene Farrier, RN   Prenatal labs: ABO, Rh:   O pos Antibody:  neg Rubella:  immune RPR:   neg HBsAg:   neg HIV:   neg GC/Chlamydia: neg GBS:   neg 1-hr GTT: 92 Genetic screening:  Panorama low risk Anatomy US: low lying placenta (resolved)  Prenatal Transfer Tool  Maternal Diabetes: No Genetic Screening: Normal Maternal Ultrasounds/Referrals: Normal Fetal Ultrasounds or other Referrals:  None Maternal Substance Abuse:  No Significant Maternal Medications:  None Significant Maternal Lab Results: None  No results found for this or any previous visit (from the past 24 hour(s)).  Patient Active  Problem List   Diagnosis Date Noted  . Language barrier 01/09/2017    Assessment: Dana Wade is a 38 y.o. [id number] at [redacted]w[redacted]d here for SOL  #Labor:  Progressing and active. Expectant management.  #Pain: Desires unmedicated delivery  #FWB:  Cat 1 #ID:  GBS neg #MOF: Breast and formula #MOC: Nexplanon #Circ:  NA  Dana Arcos,MD 02/21/2019, 10:18 AM

## 2019-02-21 NOTE — Progress Notes (Addendum)
L&D Note  Bedside u/s by me confirms breech, looks complete and head in midline with spine on maternal right, normal FHR. Pt 9cm most recently at arom with moderate mec. Fetus category I with accels, q5-19m UCs currently.   D/w her that recommend c/s and she is amenable to this. D/w her re: permanency of BTL and she would like this. Pt consent for c/s and btl. Will do ancef and azithro. Will get a dose of terbutaline to slow down contractions. OR aware  Pt seen with interpreter.   Cornelia Copa MD Attending Center for [employer] (Faculty Practice) 02/21/2019 Time: [phone]

## 2019-02-21 NOTE — MAU Note (Signed)
Had some contractions last night, "not too bad", this morning are every 5-62min.  Some bleeding.  No leaking.

## 2019-02-21 NOTE — Anesthesia Postprocedure Evaluation (Signed)
Anesthesia Post Note  Patient: Janera M Munoz-Garcia  Procedure(s) Performed: CESAREAN SECTION (N/A Abdomen)     Patient location during evaluation: PACU Anesthesia Type: Spinal and MAC Level of consciousness: awake and alert Pain management: pain level controlled Vital Signs Assessment: post-procedure vital signs reviewed and stable Respiratory status: spontaneous breathing and respiratory function stable Cardiovascular status: blood pressure returned to baseline and stable Postop Assessment: spinal receding Anesthetic complications: no    Last Vitals:  Vitals:   02/21/19 1800 02/21/19 1815  BP: 104/62   Pulse: 70 72  Resp: 15 17  Temp: 36.4 C   SpO2: 97% 98%    Last Pain:  Vitals:   02/21/19 1815  TempSrc:   PainSc: 0-No pain   Pain Goal:    LLE Motor Response: Purposeful movement (02/21/19 1800)   RLE Motor Response: Purposeful movement (02/21/19 1800)       Epidural/Spinal Function Cutaneous sensation: Tingles (02/21/19 1800), Patient able to flex knees: Yes (02/21/19 1800), Patient able to lift hips off bed: No (02/21/19 1800), Back pain beyond tenderness at insertion site: No (02/21/19 1800), Progressively worsening motor and/or sensory loss: No (02/21/19 1800), Bowel and/or bladder incontinence post epidural: No (02/21/19 1800)  [place]

## 2019-02-21 NOTE — Lactation Note (Signed)
This note was copied from a baby's chart. 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 Per mom BF is going well and she requested to be seen tomorrow instead to do her initial lactation assessment. LC will be back tomorrow morning, mom agreeable with the plan.  Maternal Data    Feeding Feeding Type: Breast Fed  LATCH Score Latch: Grasps breast easily, tongue down, lips flanged, rhythmical sucking.  Audible Swallowing: A few with stimulation  Type of Nipple: Everted at rest and after stimulation  Comfort (Breast/Nipple): Soft / non-tender  Hold (Positioning): No assistance needed to correctly position infant at breast.  LATCH Score: 9  Interventions    Lactation Tools Discussed/Used     Consult Status      Latorsha Curling Venetia Constable 02/21/2019, 10:10 PM

## 2019-02-22 LAB — RPR: RPR Ser Ql: NONREACTIVE

## 2019-02-22 MED ORDER — LACTATED RINGERS IV BOLUS
500.0000 mL | Freq: Once | INTRAVENOUS | Status: AC
  Administered 2019-02-22: 07:00:00 500 mL via INTRAVENOUS

## 2019-02-22 NOTE — Progress Notes (Signed)
Pt sat to side of bed independently. Asked if pt wanted to stand up, she stated "no, I would feel dizzy." Advised pt I would come back after she ate lunch to stand up and walk to bathroom

## 2019-02-22 NOTE — Progress Notes (Signed)
Subjective: Postpartum Day 1: Cesarean Delivery Patient reports incisional pain.    Objective: Vital signs in last 24 hours: Temp:  [96.2 F (35.7 C)-98.6 F (37 C)] 98.2 F (36.8 C) (04/11 1021) Pulse Rate:  [58-83] 70 (04/11 1021) Resp:  [11-18] 16 (04/11 1021) BP: (74-114)/(44-77) 92/58 (04/11 1021) SpO2:  [96 %-100 %] 96 % (04/11 1021)  Physical Exam:  General: alert and cooperative Lochia: appropriate Uterine Fundus: firm Incision: no significant drainage DVT Evaluation: No evidence of DVT seen on physical exam. Patient still has foley in. Plan to remove later today.   Recent Labs    02/21/19 1101  HGB 13.8  HCT 40.5    Assessment/Plan: Status post Cesarean section. Doing well postoperatively.  Continue current care.  Thressa Sheller DNP, CNM  02/22/19  10:52 AM   Int# [id number] used

## 2019-02-22 NOTE — Lactation Note (Signed)
This note was copied from a baby's chart. Lactation Consultation Note  Patient Name: Dana Wade [id number] Date: 02/22/2019 Reason for consult: Initial assessment;Infant weight loss;Term  62 hours old FT female who is being mostly BF by her mother, she's a P3 and experienced BF. She was able to BF her first child for 12 months and her second one for 5 months. Mom experienced BF difficulties with second baby when she had to go back to work, her milk dried out. Mom participated in the [facility] program at the [facility] and she's already familiar with hand expression, but hasn't spotted any drops of colostrum yet. Offered assistance but mom politely declined, she said this is normal and her milk usually comes in on the third day.  Baby was asleep resting on mother's chest when entering the room, baby was wearing at least two layers of clothing and wrapped in a blanket. Mom said she's been doing STS feedings yesterday but not today because she was afraid that baby was "cold".  Offered assistance with latch but mom politely declined, baby has already fed. Asked mom to call for assistance when needed. Per mom BF is going well and feedings at the breast are comfortable. Reviewed feeding cues, normal newborn behavior, benefits of STS  and cluster feeding.  Feeding plan:  1. Encouraged mom to feed baby STS 8-12 times/24 hours or sooner if feeding cues are present 2. Hand expression and spoon feeding were also encouraged  BF brochure (SP) and feeding diary (SP) were reviewed. Parents reported all questions and concerns were answered, they're both aware of LC OP services and will call PRN.  Maternal Data Formula Feeding for Exclusion: Yes Reason for exclusion: Mother's choice to formula and breast feed on admission Has patient been taught Hand Expression?: Yes Does the patient have breastfeeding experience prior to this delivery?: Yes  Feeding Feeding Type: Breast Fed   Interventions Interventions:  Breast feeding basics reviewed  Lactation Tools Discussed/Used WIC Program: Yes   Consult Status Consult Status: PRN Follow-up type: In-patient    Dana Wade 02/22/2019, 10:44 AM

## 2019-02-23 ENCOUNTER — Encounter (HOSPITAL_COMMUNITY): Payer: Self-pay | Admitting: *Deleted

## 2019-02-23 MED ORDER — HYDROCODONE-ACETAMINOPHEN 5-325 MG PO TABS: 1.0000 | ORAL_TABLET | Freq: Four times a day (QID) | ORAL | 0 refills | Status: DC | PRN

## 2019-02-23 MED ORDER — IBUPROFEN 800 MG PO TABS: 800.0000 mg | ORAL_TABLET | Freq: Three times a day (TID) | ORAL | 0 refills | Status: DC

## 2019-02-23 NOTE — Discharge Instructions (Signed)
Parto por cesrea, cuidados posteriores  Cesarean Delivery, Care After  Esta hoja le brinda informacin sobre cmo cuidarse despus del procedimiento. El mdico tambin podr darle indicaciones ms especficas. Comunquese con el mdico si tiene problemas o preguntas.  Qu puedo esperar despus del procedimiento?  Despus del procedimiento, es comn tener los siguientes sntomas:   Una pequea cantidad de sangre o de lquido transparente que proviene de la incisin.   Enrojecimiento, hinchazn y dolor en la zona de la incisin.   Dolor y molestia abdominales.   Hemorragia vaginal (loquios). Aunque no haya tenido parto vaginal, tendr sangrado y secrecin vaginal.   Calambres plvicos.   Fatiga.  Es posible que sienta dolor, hinchazn y molestias en el tejido que se encuentra entre la vagina y el ano (perineo) en los siguientes casos:   Si la cesrea no fue planificada y le permitieron realizar el trabajo de parto y pujar.   Si le hicieron una incisin en la zona (episiotoma) o el tejido se desgarr durante el intento de parto vaginal.  Siga estas indicaciones en su casa:  Cuidados de la incisin     Siga las indicaciones del mdico acerca del cuidado de la incisin. Asegrese de hacer lo siguiente:  ? Lvese las manos con agua y jabn antes de cambiar las vendas (vendaje). Use desinfectante para manos si no dispone de agua y jabn.  ? Si tiene un vendaje, cmbielo o quteselo siguiendo las indicaciones del mdico.  ? No retire los puntos (suturas), las grapas cutneas, la goma para cerrar la piel o las tiras adhesivas. Es posible que estos cierres cutneos deban permanecer en la piel durante 2semanas o ms tiempo. Si los bordes de las tiras adhesivas empiezan a despegarse y enroscarse, puede recortar los que estn sueltos. No retire las tiras adhesivas por completo a menos que el mdico se lo indique.   Controle todos los das la zona de la incisin para detectar signos de infeccin. Est atento a los  siguientes signos:  ? Aumento del enrojecimiento, la hinchazn o el dolor.  ? Ms lquido o sangre.  ? Calor.  ? Pus o mal olor.   No tome baos de inmersin, no nade ni use un jacuzzi hasta que el mdico la autorice. Pregntele al mdico si puede ducharse.   Cuando tosa o estornude, abrace una almohada. Esto ayuda con el dolor y disminuye la posibilidad de que su incisin se abra (dehiscencia). Haga esto hasta que la incisin cicatrice.  Medicamentos   Tome los medicamentos de venta libre y los recetados solamente como se lo haya indicado el mdico.   Si le recetaron un antibitico, tmelo como se lo haya indicado el mdico. No deje de tomar el antibitico aunque comience a sentirse mejor.   No conduzca ni use maquinaria pesada mientras toma analgsicos recetados.  Estilo de vida   No beba alcohol. Esto es de suma importancia si est amamantando o toma analgsicos.   No consuma ningn producto que contenga nicotina o tabaco, como cigarrillos, cigarrillos electrnicos y tabaco de mascar. Si necesita ayuda para dejar de fumar, consulte al mdico.  Comida y bebida   Beba al menos 8vasos de ochoonzas (240cc) de agua todos los das a menos que el mdico le indique lo contrario. Si amamanta, quiz deba beber an ms cantidad de agua.   Coma alimentos ricos en fibras todos los das. Estos alimentos pueden ayudar a prevenir o aliviar el estreimiento. Los alimentos ricos en fibra incluyen los siguientes:  ?   Panes y cereales integrales.  ? Arroz integral.  ? Frijoles.  ? Frutas y verduras frescas.  Actividad     Si es posible, pdale a alguien que la ayude a cuidar del beb y con las tareas del hogar durante al menos algunos das despus de que le den el alta del hospital.   Retome sus actividades normales como se lo haya indicado el mdico. Pregntele al mdico qu actividades son seguras para usted.   Descanse todo lo que pueda. Trate de descansar o tomar una siesta mientras el beb duerme.   No levante  ningn objeto que pese ms de 10libras (4,5kg) o el lmite de peso que le hayan indicado, hasta que el mdico le diga que puede hacerlo.   Hable con el mdico sobre cundo puede retomar la actividad sexual. Esto puede depender de lo siguiente:  ? Riesgo de sufrir una infeccin.  ? La rapidez con que cicatrice.  ? Comodidad y deseo de retomar la actividad sexual.  Indicaciones generales   No use tampones ni se haga duchas vaginales hasta que el mdico la autorice.   Use ropa floja y cmoda y un sostn firme y que le calce bien.   Mantenga el perineo limpio y seco. Cuando vaya al bao, siempre higiencese de adelante hacia atrs.   Si expulsa un cogulo de sangre, gurdelo y llame al mdico para informrselo. No deseche los cogulos de sangre por el inodoro antes de recibir indicaciones del mdico.   Concurra a todas las visitas de seguimiento para usted y el beb, como se lo haya indicado el mdico. Esto es importante.  Comunquese con un mdico si:   Tiene los siguientes sntomas:  ? Fiebre.  ? Secrecin vaginal con mal olor.  ? Pus o mal olor en el lugar de la incisin.  ? Dificultad o dolor al orinar.  ? Aumento o disminucin repentinos de la frecuencia de las deposiciones.  ? Aumento del enrojecimiento, la hinchazn o el dolor alrededor de la incisin.  ? Aumento del lquido o sangre proveniente de la incisin.  ? Erupcin cutnea.  ? Nuseas.  ? Poco inters o falta de inters en actividades que solan gustarle.  ? Dudas sobre su cuidado y el del beb.   Su incisin se siente caliente al tacto.   Siente dolor en las mamas y se ponen rojas o duras.   Siente tristeza o preocupacin de forma inusual.   Vomita.   Elimina un cogulo de sangre grande por la vagina.   Orina ms de lo habitual.   Se siente mareado o aturdido.  Solicite ayuda inmediatamente si:   Tiene los siguientes sntomas:  ? Dolor que no desaparece o no mejora con medicamentos.  ? Dolor en el pecho.  ? Dificultad para  respirar.  ? Visin borrosa o manchas en la visin.  ? Pensamientos de autolesionarse o lesionar al beb.  ? Nuevo dolor en el abdomen o en una de las piernas.  ? Dolor de cabeza intenso.   Se desmaya.   Tiene una hemorragia tan intensa en la vagina que empapa ms de un apsito en una hora. El sangrado no debe ser ms abundante que el perodo ms intenso que haya tenido.  Resumen   Despus del procedimiento, es comn tener dolor en el lugar de la incisin, clicos abdominales, y sangrado vaginal leve.   Controle todos los das la zona de la incisin para detectar signos de infeccin.   Informe al mdico sobre cualquier sntoma

## 2019-03-07 ENCOUNTER — Ambulatory Visit: Payer: Self-pay

## 2019-03-11 ENCOUNTER — Ambulatory Visit (INDEPENDENT_AMBULATORY_CARE_PROVIDER_SITE_OTHER): Payer: Self-pay

## 2019-03-11 ENCOUNTER — Other Ambulatory Visit: Payer: Self-pay

## 2019-03-11 VITALS — BP 113/84 | HR 89

## 2019-03-11 DIAGNOSIS — Z5189 Encounter for other specified aftercare: Secondary | ICD-10-CM

## 2019-03-11 NOTE — Progress Notes (Signed)
I have reviewed the chart and agree with nursing staff's documentation of this patient's encounter.  Jaynie Collins, MD 03/11/2019 12:22 PM

## 2019-03-11 NOTE — Progress Notes (Signed)
Pt here today for incision check s/p c-section on 02/21/19.  Pt incision is well approximated, no odor, no drainage, and no redness.  Observed pt's incision with superficial blistering under steri strips.  Notified Dr. Macon Large, who removed the steri strips, cleansed with sterile water, and informed pt with spanish interpreter Eda R. to apply hydrocortisone cream to help with itching.  Adhesive tape allergy added to pt's allergy list.  Pt will get pp visit scheduled before leaving.  Pt verbalized understanding.   Addison Naegeli, RN

## 2019-03-28 ENCOUNTER — Telehealth: Payer: Self-pay | Admitting: Family Medicine

## 2019-03-28 NOTE — Telephone Encounter (Signed)
Attempted to call patient with her rescheduled appointment with Spanish interpreter ID # [phone]. No answer, left appointment information 5/21 @ 2:15 ( Virtual) informed to downloaded the [employer] webex meetings app if not already downloaded and that she did not have to come to the office for this appointment.

## 2019-03-31 ENCOUNTER — Ambulatory Visit: Payer: Self-pay | Admitting: Advanced Practice Midwife

## 2019-04-03 ENCOUNTER — Other Ambulatory Visit: Payer: Self-pay

## 2019-04-03 ENCOUNTER — Ambulatory Visit (INDEPENDENT_AMBULATORY_CARE_PROVIDER_SITE_OTHER): Payer: Self-pay | Admitting: Obstetrics and Gynecology

## 2019-04-03 ENCOUNTER — Encounter: Payer: Self-pay | Admitting: Obstetrics and Gynecology

## 2019-04-03 NOTE — Progress Notes (Signed)
TELEHEALTH POSTPARTUM VIRTUAL VIDEO VISIT ENCOUNTER NOTE  [facility]   I connected with Dana Wade on 04/03/19 at  2:15 PM EDT by WebEx Encounter at home using [facility] [phone] and verified that I am speaking with the correct person using two identifiers.    I discussed the limitations, risks, security and privacy concerns of performing an evaluation and management service by telephone and the availability of in person appointments. I also discussed with the patient that there may be a patient responsible charge related to this service. The patient expressed understanding and agreed to proceed.  Appointment Date: 04/03/2019  OBGYN Clinic: [facility]  Chief Complaint: Postpartum Visit  History of Present Illness: Dana Wade is a 38 y.o. Hispanic [id number] being evaluated for postpartum followup.    She is s/p primary cesarean section on April 10th at 41.2 weeks; she was discharged to home on April 12th. Pregnancy complicated by Advanced Maternal Age. Baby is doing well.  No Complaints.  Vaginal bleeding or discharge: No  Intercourse: No  Contraception: bilateral tubal ligation Mode of feeding infant: Breast PP depression s/s: No .  Any bowel or bladder issues: No  Pap smear: no abnormalities   (date: 2019)   Review of Systems: Her 12 point review of systems is negative or as noted in the History of Present Illness.  Patient Active Problem List   Diagnosis Date Noted  . Postpartum care following cesarean delivery 04/03/2019  . Personal history of previous postdates pregnancy 02/21/2019  . Unwanted fertility 02/21/2019  . Breech presentation delivered 02/21/2019  . Cesarean delivery delivered 02/21/2019  . Language barrier 01/09/2017  . Advanced maternal age in multigravida     Medications Dana Wade had no medications administered during this visit. Current Outpatient Medications   Medication Sig Dispense Refill  . Prenatal Vit-Fe Fumarate-FA (PRENATAL VITAMIN PO) Take by mouth.     No current facility-administered medications for this visit.     Allergies Adhesive [tape]  Physical Exam:  LMP  (Exact Date)   Breastfeeding Yes    General:  Alert, oriented and cooperative. Patient is in no acute distress.  Mental Status: Normal mood and affect. Normal behavior. Normal judgment and thought content.   Respiratory: Normal respiratory effort noted, no problems with respiration noted  Rest of physical exam deferred due to type of encounter  PP Depression Screening:   Edinburgh Postnatal Depression Scale - 04/03/19 1326      Edinburgh Postnatal Depression Scale:  In the Past 7 Days   I have been able to laugh and see the funny side of things.  0    I have looked forward with enjoyment to things.  0    I have blamed myself unnecessarily when things went wrong.  0    I have been anxious or worried for no good reason.  0    I have felt scared or panicky for no good reason.  0    Things have been getting on top of me.  0    I have been so unhappy that I have had difficulty sleeping.  0    I have felt sad or miserable.  0    I have been so unhappy that I have been crying.  0    The thought of harming myself has occurred to me.  0    Edinburgh Postnatal Depression Scale Total  0       Assessment:Patient  is a 38 y.o. [id number] who is 5 weeks postpartum from a primary cesarean section.  She is doing well. She underwent BTL and is doing well.   BP reading 3 weeks ago done in the office for incision check read 113/84. Patient does not have access to a BP cuff  Plan: f/u as needed   RTC in one year for annual   I discussed the assessment and treatment plan with the patient. The patient was provided an opportunity to ask questions and all were answered. The patient agreed with the plan and demonstrated an understanding of the instructions.   The patient was advised to  call back or seek an in-person evaluation/go to the ED for any concerning postpartum symptoms.  Spanish interpretor used for encounter.  I provided 10 minutes of face-to-face time during this encounter.  Venia Carbon I, NP 04/03/2019 1:45 PM

## 2019-04-14 ENCOUNTER — Other Ambulatory Visit: Payer: Self-pay

## 2019-04-14 ENCOUNTER — Ambulatory Visit (INDEPENDENT_AMBULATORY_CARE_PROVIDER_SITE_OTHER): Payer: Self-pay | Admitting: Lactation Services

## 2019-04-14 VITALS — BP 121/69 | HR 66 | Wt 152.2 lb

## 2019-04-14 DIAGNOSIS — Z5189 Encounter for other specified aftercare: Secondary | ICD-10-CM

## 2019-04-14 NOTE — Progress Notes (Signed)
Pt here with Milly, the [facility] Spanish Interpreter. Patient s/p c/s on 4/10. She is concerned that she has 2 areas on her c/s incision that has had a little bleeding at times. She is concerned about infection. 2 small scabs are noted about 1.5 inches from each end of her incision, scabs are dry and without drainage.   Infection dry, well approximated and without redness, swelling or drainage noted. Dr. Vergie Living looked at the incision and felt it looked normal.   Pt advised not to rub the incision or scrub during baths and when drying off. Pt voiced understanding.   Pt to return for follow up as needed. Pt reports she has no further questions/concerns at this time.

## 2021-09-21 ENCOUNTER — Ambulatory Visit (HOSPITAL_COMMUNITY)
Admission: EM | Admit: 2021-09-21 | Discharge: 2021-09-21 | Disposition: A | Discharge status: Home / Self Care | Payer: Self-pay | Attending: Medical Oncology | Admitting: Medical Oncology

## 2021-09-21 ENCOUNTER — Encounter (HOSPITAL_COMMUNITY): Payer: Self-pay

## 2021-09-21 ENCOUNTER — Other Ambulatory Visit: Payer: Self-pay

## 2021-09-21 DIAGNOSIS — N3001 Acute cystitis with hematuria: Secondary | ICD-10-CM

## 2021-09-21 LAB — POCT URINALYSIS DIPSTICK, ED / UC
Bilirubin Urine: NEGATIVE
Glucose, UA: NEGATIVE mg/dL
Ketones, ur: 80 mg/dL — AB
Nitrite: NEGATIVE
Protein, ur: NEGATIVE mg/dL
Specific Gravity, Urine: 1.02 (ref 1.005–1.030)
Urobilinogen, UA: 1 mg/dL (ref 0.0–1.0)
pH: 5.5 (ref 5.0–8.0)

## 2021-09-21 MED ORDER — ONDANSETRON 4 MG PO TBDP: 4.0000 mg | ORAL_TABLET | Freq: Three times a day (TID) | ORAL | 0 refills | Status: DC | PRN

## 2021-09-21 MED ORDER — SULFAMETHOXAZOLE-TRIMETHOPRIM 800-160 MG PO TABS: 1.0000 | ORAL_TABLET | Freq: Two times a day (BID) | ORAL | 0 refills | Status: AC

## 2021-09-21 NOTE — ED Triage Notes (Signed)
Pt reports lower back pain, nausea, burning when urinating, nausea, fever  and headache x 3 days

## 2021-09-21 NOTE — ED Provider Notes (Signed)
[facility]    CSN: [id number] Arrival date & time: 09/21/21  1635      History   Chief Complaint Chief Complaint  Patient presents with   Back Pain   Dysuria         HPI Dana Wade is a 40 y.o. female. Medical interpretor assisted with our visit today   HPI  Dysuria: Pt reports that for the past 3 days she has had lower back pain, nausea, dysuria, fever, headache. She has tried tylenol for symptoms without much relief. She denies abdominal pain, known fever, vomiting .    Past Medical History:  Diagnosis Date   Infection    kidney infection 4-5 years ago    Patient Active Problem List   Diagnosis Date Noted   Postpartum care following cesarean delivery 04/03/2019   Personal history of previous postdates pregnancy 02/21/2019   Unwanted fertility 02/21/2019   Breech presentation delivered 02/21/2019   Cesarean delivery delivered 02/21/2019   Language barrier 01/09/2017   Advanced maternal age in multigravida     Past Surgical History:  Procedure Laterality Date   CESAREAN SECTION N/A 02/21/2019   Procedure: CESAREAN SECTION;  Surgeon: Aletha Halim, MD;  Location: [facility];  Service: Obstetrics;  Laterality: N/A;   DILATION AND EVACUATION N/A 12/31/2015   Procedure: DILATATION AND EVACUATION;  Surgeon: Osborne Oman, MD;  Location: [facility];  Service: Gynecology;  Laterality: N/A;   INCISION AND DRAINAGE POSTERIOR SACRAL SPINE  2006   fell while pregnant, had large hematoma    OB History     Gravida  4   Para  2   Term  2   Preterm  0   AB  1   Living  2      SAB  1   IAB  0   Ectopic  0   Multiple  0   Live Births  2            Home Medications    Prior to Admission medications   Medication Sig Start Date End Date Taking? Authorizing Provider  Prenatal Vit-Fe Fumarate-FA (PRENATAL VITAMIN PO) Take by mouth.    [provider]    Family History Family History  Problem Relation Age of Onset    Hypertension Father     Social History Social History   Tobacco Use   Smoking status: Never   Smokeless tobacco: Never  Vaping Use   Vaping Use: Never used  Substance Use Topics   Alcohol use: No   Drug use: Never     Allergies   Adhesive [tape]   Review of Systems Review of Systems  As stated above in HPI Physical Exam Triage Vital Signs ED Triage Vitals  Enc Vitals Group     BP 09/21/21 1820 106/66     Pulse Rate 09/21/21 1820 80     Resp 09/21/21 1820 18     Temp 09/21/21 1820 98.7 F (37.1 C)     Temp Source 09/21/21 1820 Oral     SpO2 09/21/21 1820 100 %     Weight --      Height --      Head Circumference --      Peak Flow --      Pain Score 09/21/21 1818 6     Pain Loc --      Pain Edu? --      Excl. in [place]? --    No data found.  Updated Vital Signs BP 106/66 (BP Location: Left Arm)   Pulse 80   Temp 98.7 F (37.1 C) (Oral)   Resp 18   LMP  (Within Days)   SpO2 100%   Breastfeeding No   Physical Exam Vitals and nursing note reviewed.  Constitutional:      General: She is not in acute distress.    Appearance: Normal appearance. She is not ill-appearing, toxic-appearing or diaphoretic.  HENT:     Mouth/Throat:     Mouth: Mucous membranes are moist.  Cardiovascular:     Rate and Rhythm: Normal rate and regular rhythm.     Pulses: Normal pulses.     Heart sounds: Normal heart sounds.  Pulmonary:     Effort: Pulmonary effort is normal.     Breath sounds: Normal breath sounds.  Abdominal:     General: Bowel sounds are normal. There is no distension.     Palpations: Abdomen is soft. There is no mass.     Tenderness: There is no abdominal tenderness. There is no right CVA tenderness, left CVA tenderness, guarding or rebound.     Hernia: No hernia is present.  Musculoskeletal:     Cervical back: Neck supple.  Lymphadenopathy:     Cervical: No cervical adenopathy.  Skin:    General: Skin is warm.     Coloration: Skin is not jaundiced.   Neurological:     Mental Status: She is alert.     UC Treatments / Results  Labs (all labs ordered are listed, but only abnormal results are displayed) Labs Reviewed  POCT URINALYSIS DIPSTICK, ED / UC - Abnormal; Notable for the following components:      Result Value   Ketones, ur 80 (*)    Hgb urine dipstick SMALL (*)    Leukocytes,Ua SMALL (*)    All other components within normal limits    EKG   Radiology No results found.  Procedures Procedures (including critical care time)  Medications Ordered in UC Medications - No data to display  Initial Impression / Assessment and Plan / UC Course  I have reviewed the triage vital signs and the nursing notes.  Pertinent labs & imaging results that were available during my care of the patient were reviewed by me and considered in my medical decision making (see chart for details).     New.  Treating for acute cystitis with hematuria with Bactrim.  Also sending in Zofran.  Discussed the importance of hydration with water.  Discussed red flag signs and symptoms.  Follow-up as needed. Final Clinical Impressions(s) / UC Diagnoses   Final diagnoses:  None   Discharge Instructions   None    ED Prescriptions   None    PDMP not reviewed this encounter.   Rushie Chestnut, [place] 09/21/21 1846

## 2022-03-30 ENCOUNTER — Other Ambulatory Visit: Payer: Self-pay

## 2022-03-30 ENCOUNTER — Ambulatory Visit (HOSPITAL_COMMUNITY)
Admission: EM | Admit: 2022-03-30 | Discharge: 2022-03-30 | Disposition: A | Discharge status: Home / Self Care | Payer: Self-pay | Attending: Family Medicine | Admitting: Family Medicine

## 2022-03-30 ENCOUNTER — Emergency Department (HOSPITAL_COMMUNITY): Payer: Self-pay

## 2022-03-30 ENCOUNTER — Emergency Department (HOSPITAL_COMMUNITY)
Admission: EM | Admit: 2022-03-30 | Discharge: 2022-03-31 | Disposition: A | Discharge status: Home / Self Care | Payer: Self-pay | Attending: Emergency Medicine | Admitting: Emergency Medicine

## 2022-03-30 ENCOUNTER — Encounter (HOSPITAL_COMMUNITY): Payer: Self-pay | Admitting: Emergency Medicine

## 2022-03-30 DIAGNOSIS — R197 Diarrhea, unspecified: Secondary | ICD-10-CM | POA: Insufficient documentation

## 2022-03-30 DIAGNOSIS — N10 Acute tubulo-interstitial nephritis: Secondary | ICD-10-CM | POA: Insufficient documentation

## 2022-03-30 DIAGNOSIS — E876 Hypokalemia: Secondary | ICD-10-CM | POA: Insufficient documentation

## 2022-03-30 DIAGNOSIS — N390 Urinary tract infection, site not specified: Secondary | ICD-10-CM | POA: Insufficient documentation

## 2022-03-30 DIAGNOSIS — R000 Tachycardia, unspecified: Secondary | ICD-10-CM | POA: Insufficient documentation

## 2022-03-30 DIAGNOSIS — R112 Nausea with vomiting, unspecified: Secondary | ICD-10-CM

## 2022-03-30 DIAGNOSIS — E860 Dehydration: Secondary | ICD-10-CM | POA: Insufficient documentation

## 2022-03-30 DIAGNOSIS — N12 Tubulo-interstitial nephritis, not specified as acute or chronic: Secondary | ICD-10-CM

## 2022-03-30 LAB — URINALYSIS, ROUTINE W REFLEX MICROSCOPIC
Bilirubin Urine: NEGATIVE
Glucose, UA: NEGATIVE mg/dL
Ketones, ur: 80 mg/dL — AB
Nitrite: POSITIVE — AB
Protein, ur: 30 mg/dL — AB
Specific Gravity, Urine: 1.03 (ref 1.005–1.030)
pH: 5 (ref 5.0–8.0)

## 2022-03-30 LAB — COMPREHENSIVE METABOLIC PANEL
ALT: 21 U/L (ref 0–44)
AST: 22 U/L (ref 15–41)
Albumin: 3.7 g/dL (ref 3.5–5.0)
Alkaline Phosphatase: 64 U/L (ref 38–126)
Anion gap: 10 (ref 5–15)
BUN: 12 mg/dL (ref 6–20)
CO2: 21 mmol/L — ABNORMAL LOW (ref 22–32)
Calcium: 8.2 mg/dL — ABNORMAL LOW (ref 8.9–10.3)
Chloride: 102 mmol/L (ref 98–111)
Creatinine, Ser: 0.68 mg/dL (ref 0.44–1.00)
GFR, Estimated: >60 mL/min (ref >60)
Glucose, Bld: 103 mg/dL — ABNORMAL HIGH (ref 70–99)
Potassium: 2.9 mmol/L — ABNORMAL LOW (ref 3.5–5.1)
Sodium: 133 mmol/L — ABNORMAL LOW (ref 135–145)
Total Bilirubin: 0.4 mg/dL (ref 0.3–1.2)
Total Protein: 6.4 g/dL — ABNORMAL LOW (ref 6.5–8.1)

## 2022-03-30 LAB — CBC WITH DIFFERENTIAL/PLATELET
Abs Immature Granulocytes: 0.05 10*3/uL (ref 0.00–0.07)
Basophils Absolute: 0 10*3/uL (ref 0.0–0.1)
Basophils Relative: 0 %
Eosinophils Absolute: 0 10*3/uL (ref 0.0–0.5)
Eosinophils Relative: 0 %
HCT: 40.5 % (ref 36.0–46.0)
Hemoglobin: 13.5 g/dL (ref 12.0–15.0)
Immature Granulocytes: 1 %
Lymphocytes Relative: 12 %
Lymphs Abs: 1 10*3/uL (ref 0.7–4.0)
MCH: 30.1 pg (ref 26.0–34.0)
MCHC: 33.3 g/dL (ref 30.0–36.0)
MCV: 90.4 fL (ref 80.0–100.0)
Monocytes Absolute: 0.5 10*3/uL (ref 0.1–1.0)
Monocytes Relative: 7 %
Neutro Abs: 6.2 10*3/uL (ref 1.7–7.7)
Neutrophils Relative %: 80 %
Platelets: 284 10*3/uL (ref 150–400)
RBC: 4.48 MIL/uL (ref 3.87–5.11)
RDW: 12.4 % (ref 11.5–15.5)
WBC: 7.7 10*3/uL (ref 4.0–10.5)
nRBC: 0 % (ref 0.0–0.2)

## 2022-03-30 LAB — I-STAT BETA HCG BLOOD, ED (MC, WL, AP ONLY): I-stat hCG, quantitative: <5 m[IU]/mL (ref <5)

## 2022-03-30 LAB — POCT URINALYSIS DIPSTICK, ED / UC
Bilirubin Urine: NEGATIVE
Glucose, UA: NEGATIVE mg/dL
Ketones, ur: 80 mg/dL — AB
Leukocytes,Ua: NEGATIVE
Nitrite: POSITIVE — AB
Protein, ur: 30 mg/dL — AB
Specific Gravity, Urine: >=1.03 (ref 1.005–1.030)
Urobilinogen, UA: 1 mg/dL (ref 0.0–1.0)
pH: 6 (ref 5.0–8.0)

## 2022-03-30 LAB — POC URINE PREG, ED
Preg Test, Ur: NEGATIVE
Preg Test, Ur: NEGATIVE

## 2022-03-30 LAB — LIPASE, BLOOD: Lipase: 28 U/L (ref 11–51)

## 2022-03-30 MED ORDER — SODIUM CHLORIDE 0.9 % IV BOLUS
1000.0000 mL | Freq: Once | INTRAVENOUS | Status: AC
  Administered 2022-03-30: 1000 mL via INTRAVENOUS

## 2022-03-30 MED ORDER — ACETAMINOPHEN 325 MG PO TABS
975.0000 mg | ORAL_TABLET | Freq: Once | ORAL | Status: AC
  Administered 2022-03-30: 975 mg via ORAL

## 2022-03-30 MED ORDER — ONDANSETRON 4 MG PO TBDP
4.0000 mg | ORAL_TABLET | Freq: Once | ORAL | Status: AC
  Administered 2022-03-30: 4 mg via ORAL

## 2022-03-30 MED ORDER — SODIUM CHLORIDE 0.9 % IV SOLN
1.0000 g | Freq: Once | INTRAVENOUS | Status: AC
  Administered 2022-03-30: 1 g via INTRAVENOUS
  Filled 2022-03-30: qty 10

## 2022-03-30 MED ORDER — ONDANSETRON HCL 4 MG/2ML IJ SOLN
4.0000 mg | Freq: Once | INTRAMUSCULAR | Status: AC
  Administered 2022-03-30: 4 mg via INTRAVENOUS
  Filled 2022-03-30: qty 2

## 2022-03-30 MED ORDER — CEPHALEXIN 500 MG PO CAPS
500.0000 mg | ORAL_CAPSULE | Freq: Three times a day (TID) | ORAL | 0 refills | Status: DC
Start: 2022-03-30 — End: 2023-04-24

## 2022-03-30 MED ORDER — ONDANSETRON 4 MG PO TBDP
ORAL_TABLET | ORAL | Status: AC
  Filled 2022-03-30: qty 1

## 2022-03-30 MED ORDER — POTASSIUM CHLORIDE CRYS ER 20 MEQ PO TBCR
40.0000 meq | EXTENDED_RELEASE_TABLET | Freq: Once | ORAL | Status: AC
  Administered 2022-03-30: 40 meq via ORAL
  Filled 2022-03-30: qty 2

## 2022-03-30 MED ORDER — IOHEXOL 300 MG/ML  SOLN
100.0000 mL | Freq: Once | INTRAMUSCULAR | Status: AC | PRN
  Administered 2022-03-30: 100 mL via INTRAVENOUS

## 2022-03-30 MED ORDER — MORPHINE SULFATE (PF) 4 MG/ML IV SOLN
4.0000 mg | Freq: Once | INTRAVENOUS | Status: AC
  Administered 2022-03-30: 4 mg via INTRAVENOUS
  Filled 2022-03-30: qty 1

## 2022-03-30 MED ORDER — NAPROXEN 500 MG PO TABS: 500.0000 mg | ORAL_TABLET | Freq: Two times a day (BID) | ORAL | 0 refills | Status: DC

## 2022-03-30 MED ORDER — ONDANSETRON HCL 4 MG PO TABS: 4.0000 mg | ORAL_TABLET | Freq: Four times a day (QID) | ORAL | 0 refills | Status: DC

## 2022-03-30 MED ORDER — SODIUM CHLORIDE 0.9 % IV BOLUS
1000.0000 mL | Freq: Once | INTRAVENOUS | Status: AC
  Administered 2022-03-31: 1000 mL via INTRAVENOUS

## 2022-03-30 MED ORDER — ACETAMINOPHEN 325 MG PO TABS
ORAL_TABLET | ORAL | Status: AC
  Filled 2022-03-30: qty 3

## 2022-03-30 NOTE — ED Triage Notes (Signed)
Diarrhea and fever since yesterday, general aches and pain, particularly in back.  2 episodes of diarrhea today.  Mild nausea, no vomiting.  Today noticed pain with urination

## 2022-03-30 NOTE — ED Notes (Signed)
Patient is being discharged from the Urgent Care and sent to the Emergency Department via POV . Per Sharyn Lull, [place], patient is in need of higher level of care due to potential pylonephritis, dehydration. Patient is aware and verbalizes understanding of plan of care.  Vitals:   03/30/22 1740  BP: 105/72  Pulse: (!) 109  Resp: 20  Temp: 99.9 F (37.7 C)  SpO2: 97%

## 2022-03-30 NOTE — ED Provider Triage Note (Signed)
Emergency Medicine Provider Triage Evaluation Note  Dana Wade , a 41 y.o. female  was evaluated in triage.  Pt complains of diarrhea, abdominal pain, nausea, dysuria and back pain.  Sent from urgent care to the ER for concerns for dehydration and pyelonephritis.  Found to have a UTI on urinalysis at urgent care.  Has not been taking medicine to help with her symptoms.  No sick contacts with similar symptoms.  No fever.  Review of Systems  Positive: Abdominal pain, diarrhea, nausea, dysuria and back pain Negative: Fever  Physical Exam  LMP 03/08/2022  Gen:   Awake, no distress   Resp:  Normal effort  MSK:   Moves extremities without difficulty  Other:  Epigastric tenderness without rebound or guarding  Medical Decision Making  Medically screening exam initiated at 6:47 PM.  Appropriate orders placed.  Dana Wade was informed that the remainder of the evaluation will be completed by another provider, this initial triage assessment does not replace that evaluation, and the importance of remaining in the ED until their evaluation is complete.  Labs and urinalysis ordered   Dietrich Pates, Cordelia Poche 03/30/22 1857

## 2022-03-30 NOTE — Discharge Instructions (Signed)
Please go to [facility] emergency department immediately for further evaluation and management of your possible kidney infection.  We gave you Zofran for nausea and Tylenol for your fever today urgent care. I hope you feel better!

## 2022-03-30 NOTE — ED Provider Notes (Signed)
[facility]    CSN: [id number] Arrival date & time: 03/30/22  1515      History   Chief Complaint Chief Complaint  Patient presents with   Diarrhea    HPI Dana Wade is a 41 y.o. female.   Patient presents to urgent care with her husband for evaluation of 2-day history of diarrhea and 1 day history of dysuria.  She cleans residences for living and states that she has not been drinking enough water. Reports generalized abdominal pain and pain to mid back, especially the right side. Reports mild dizziness more so upon s standing. Denies recent antibiotic use and camping. Denies headache, chest pain, shortness of breath, hematuria, melena, and vaginal symptoms. She denies frequent urinary tract infections. She is nauseous at this time, but denies vomiting. No documented fever at home, but reports intermittent chills. Denies any other aggravating or relieving factors.    Diarrhea  Past Medical History:  Diagnosis Date   Infection    kidney infection 4-5 years ago    Patient Active Problem List   Diagnosis Date Noted   Postpartum care following cesarean delivery 04/03/2019   Personal history of previous postdates pregnancy 02/21/2019   Unwanted fertility 02/21/2019   Breech presentation delivered 02/21/2019   Cesarean delivery delivered 02/21/2019   Language barrier 01/09/2017   Advanced maternal age in multigravida     Past Surgical History:  Procedure Laterality Date   CESAREAN SECTION N/A 02/21/2019   Procedure: CESAREAN SECTION;  Surgeon: Potosi Bing, MD;  Location: [facility];  Service: Obstetrics;  Laterality: N/A;   DILATION AND EVACUATION N/A 12/31/2015   Procedure: DILATATION AND EVACUATION;  Surgeon: Tereso Newcomer, MD;  Location: [facility];  Service: Gynecology;  Laterality: N/A;   INCISION AND DRAINAGE POSTERIOR SACRAL SPINE  2006   fell while pregnant, had large hematoma    OB History     Gravida  4   Para  2   Term  2    Preterm  0   AB  1   Living  2      SAB  1   IAB  0   Ectopic  0   Multiple  0   Live Births  2            Home Medications    Prior to Admission medications   Medication Sig Start Date End Date Taking? Authorizing Provider  ondansetron (ZOFRAN ODT) 4 MG disintegrating tablet Take 1 tablet (4 mg total) by mouth every 8 (eight) hours as needed for nausea or vomiting. Patient not taking: Reported on 03/30/2022 09/21/21   Rushie Chestnut, PA-C  Prenatal Vit-Fe Fumarate-FA (PRENATAL VITAMIN PO) Take by mouth.    [provider]    Family History Family History  Problem Relation Age of Onset   Hypertension Father     Social History Social History   Tobacco Use   Smoking status: Never   Smokeless tobacco: Never  Vaping Use   Vaping Use: Never used  Substance Use Topics   Alcohol use: No   Drug use: Never     Allergies   Adhesive [tape]   Review of Systems Review of Systems  Gastrointestinal:  Positive for diarrhea.  Per HPI  Physical Exam Triage Vital Signs ED Triage Vitals  Enc Vitals Group     BP 03/30/22 1740 105/72     Pulse Rate 03/30/22 1740 (!) 109     Resp 03/30/22 1740 20  Temp 03/30/22 1740 99.9 F (37.7 C)     Temp Source 03/30/22 1740 Oral     SpO2 03/30/22 1740 97 %     Weight --      Height --      Head Circumference --      Peak Flow --      Pain Score 03/30/22 1737 8     Pain Loc --      Pain Edu? --      Excl. in GC? --    No data found.  Updated Vital Signs BP 105/72 (BP Location: Left Arm)   Pulse (!) 109   Temp 99.9 F (37.7 C) (Oral)   Resp 20   LMP 03/08/2022   SpO2 97%   Visual Acuity Right Eye Distance:   Left Eye Distance:   Bilateral Distance:    Right Eye Near:   Left Eye Near:    Bilateral Near:     Physical Exam Vitals and nursing note reviewed.  Constitutional:      General: She is not in acute distress.    Appearance: Normal appearance. She is well-developed. She is  ill-appearing and diaphoretic.  HENT:     Head: Normocephalic and atraumatic.     Right Ear: External ear normal.     Left Ear: External ear normal.     Nose: Nose normal.     Mouth/Throat:     Mouth: Mucous membranes are dry.     Pharynx: Oropharynx is clear. No oropharyngeal exudate.     Comments: Oral mucosa appears dry.  Eyes:     Extraocular Movements: Extraocular movements intact.     Conjunctiva/sclera: Conjunctivae normal.  Cardiovascular:     Rate and Rhythm: Regular rhythm.     Heart sounds: Normal heart sounds. No murmur heard.   No friction rub. No gallop.  Pulmonary:     Effort: Pulmonary effort is normal. No respiratory distress.     Breath sounds: Normal breath sounds and air entry. No decreased breath sounds, wheezing, rhonchi or rales.  Chest:     Chest wall: No tenderness.  Abdominal:     General: Abdomen is flat. Bowel sounds are normal.     Palpations: Abdomen is soft. There is no hepatomegaly or pulsatile mass.     Tenderness: There is generalized abdominal tenderness. There is right CVA tenderness and left CVA tenderness. There is no guarding.  Musculoskeletal:        General: No swelling.     Cervical back: Normal range of motion and neck supple.  Lymphadenopathy:     Cervical: No cervical adenopathy.  Skin:    General: Skin is warm.     Capillary Refill: Capillary refill takes less than 2 seconds.     Findings: No rash.  Neurological:     General: No focal deficit present.     Mental Status: She is alert and oriented to person, place, and time. Mental status is at baseline.     Cranial Nerves: Cranial nerves 2-12 are intact.     Sensory: Sensation is intact.     Coordination: Coordination is intact.     Gait: Gait is intact.  Psychiatric:        Mood and Affect: Mood normal.        Behavior: Behavior normal. Behavior is cooperative.        Thought Content: Thought content normal.        Judgment: Judgment normal.     UC Treatments /  Results   Labs (all labs ordered are listed, but only abnormal results are displayed) Labs Reviewed  POCT URINALYSIS DIPSTICK, ED / UC - Abnormal; Notable for the following components:      Result Value   Ketones, ur 80 (*)    Hgb urine dipstick MODERATE (*)    Protein, ur 30 (*)    Nitrite POSITIVE (*)    All other components within normal limits  URINE CULTURE  POC URINE PREG, ED  POC URINE PREG, ED    EKG   Radiology No results found.  Procedures Procedures (including critical care time)  Medications Ordered in UC Medications  acetaminophen (TYLENOL) tablet 975 mg (975 mg Oral Given 03/30/22 1818)  ondansetron (ZOFRAN-ODT) disintegrating tablet 4 mg (4 mg Oral Given 03/30/22 1819)    Initial Impression / Assessment and Plan / UC Course  I have reviewed the triage vital signs and the nursing notes.  Pertinent labs & imaging results that were available during my care of the patient were reviewed by me and considered in my medical decision making (see chart for details).   Urinalysis in clinic positive for nitrites, hematuria, ketones, and proteinuria. Heart rate 109, Temp 99.9. Concern for acute complicated pyelonephritis and moderate dehydration. Recommend patient report to the emergency department for IV rehydration and possible imaging as she appears dehydrated and uncomfortable. Patient given 4mg  zofran for nausea and 975 mg tylenol for low grade fever while in urgent care prior to ED arrival for comfort due to possible wait time in ED.  Offered care link transport and patient declined. Patient is stable to go to the emergency room by POV with her husband. Patient and husband verbalize understanding and agreement with plan. All questions answered. Patient stable at time of discharge.    Final Clinical Impressions(s) / UC Diagnoses   Final diagnoses:  Diarrhea, unspecified type  Dehydration  Complicated urinary tract infection     Discharge Instructions      Please go to  [facility] emergency department immediately for further evaluation and management of your possible kidney infection.  We gave you Zofran for nausea and Tylenol for your fever today urgent care. I hope you feel better!    ED Prescriptions   None    PDMP not reviewed this encounter.   [facility], Carlisle Beers 04/01/22 1416

## 2022-03-30 NOTE — ED Provider Notes (Signed)
[facility] EMERGENCY DEPARTMENT Provider Note   CSN: [id number] Arrival date & time: 03/30/22  1827     History  Chief Complaint  Patient presents with   Diarrhea    Dana Wade is a 41 y.o. female for evaluation of feeling unwell.  Has had multiple episodes of loose stool over the last 2 days.  Recent antibiotics or travel.  Also admits to generalized abdominal pain, bilateral flank pain and dysuria.  She has had some nausea without vomiting.  Was seen at urgent care, sent here for IV fluids with concern for pyelonephritis.  Denies any chance of pregnancy.  No fever, headache, chest pain, shortness of breath, cough.  HPI     Home Medications Prior to Admission medications   Medication Sig Start Date End Date Taking? Authorizing Provider  cephALEXin (KEFLEX) 500 MG capsule Take 1 capsule (500 mg total) by mouth 3 (three) times daily. 03/30/22  Yes Zeenat Jeanbaptiste A, PA-C  naproxen (NAPROSYN) 500 MG tablet Take 1 tablet (500 mg total) by mouth 2 (two) times daily. 03/30/22  Yes Annaleigha Woo A, PA-C  ondansetron (ZOFRAN) 4 MG tablet Take 1 tablet (4 mg total) by mouth every 6 (six) hours. 03/30/22  Yes Byrant Valent A, PA-C  ondansetron (ZOFRAN ODT) 4 MG disintegrating tablet Take 1 tablet (4 mg total) by mouth every 8 (eight) hours as needed for nausea or vomiting. Patient not taking: Reported on 03/30/2022 09/21/21   Rushie Chestnut, PA-C  Prenatal Vit-Fe Fumarate-FA (PRENATAL VITAMIN PO) Take by mouth.    [provider]      Allergies    Adhesive [tape]    Review of Systems   Review of Systems  Constitutional:  Positive for fever.  HENT: Negative.    Respiratory: Negative.    Cardiovascular: Negative.   Gastrointestinal:  Positive for abdominal pain, diarrhea and nausea. Negative for abdominal distention, anal bleeding, blood in stool, constipation, rectal pain and vomiting.  Genitourinary:  Positive for dysuria, flank pain and  frequency.  Skin: Negative.   Neurological: Negative.   All other systems reviewed and are negative.  Physical Exam Updated Vital Signs BP 107/75 (BP Location: Left Arm)   Pulse 80   Temp 97.7 F (36.5 C) (Oral)   Resp 18   LMP 03/08/2022   SpO2 100%  Physical Exam Vitals and nursing note reviewed.  Constitutional:      General: She is not in acute distress.    Appearance: She is well-developed. She is not ill-appearing, toxic-appearing or diaphoretic.  HENT:     Head: Normocephalic and atraumatic.     Nose: Nose normal.     Mouth/Throat:     Mouth: Mucous membranes are dry.  Eyes:     Pupils: Pupils are equal, round, and reactive to light.  Cardiovascular:     Rate and Rhythm: Tachycardia present.     Pulses: Normal pulses.     Heart sounds: Normal heart sounds.  Pulmonary:     Effort: Pulmonary effort is normal. No respiratory distress.     Breath sounds: Normal breath sounds.  Abdominal:     General: There is no distension.     Comments: Bilateral CVA tenderness and generalized abdominal pain  Musculoskeletal:        General: Normal range of motion.     Cervical back: Normal range of motion.     Comments: No bony tenderness, full range of motion, compartment soft  Skin:    General:  Skin is warm and dry.     Capillary Refill: Capillary refill takes less than 2 seconds.     Comments: No edema, erythema or warmth.  No fluctuance or induration.  Neurological:     General: No focal deficit present.     Mental Status: She is alert and oriented to person, place, and time.  Psychiatric:        Mood and Affect: Mood normal.    ED Results / Procedures / Treatments   Labs (all labs ordered are listed, but only abnormal results are displayed) Labs Reviewed  COMPREHENSIVE METABOLIC PANEL - Abnormal; Notable for the following components:      Result Value   Sodium 133 (*)    Potassium 2.9 (*)    CO2 21 (*)    Glucose, Bld 103 (*)    Calcium 8.2 (*)    Total Protein  6.4 (*)    All other components within normal limits  URINALYSIS, ROUTINE W REFLEX MICROSCOPIC - Abnormal; Notable for the following components:   Color, Urine AMBER (*)    APPearance CLOUDY (*)    Hgb urine dipstick SMALL (*)    Ketones, ur 80 (*)    Protein, ur 30 (*)    Nitrite POSITIVE (*)    Leukocytes,Ua MODERATE (*)    Bacteria, UA FEW (*)    Non Squamous Epithelial 0-5 (*)    All other components within normal limits  LIPASE, BLOOD  CBC WITH DIFFERENTIAL/PLATELET  I-STAT BETA HCG BLOOD, ED ([facility], [facility], [facility] ONLY)    EKG None  Radiology CT ABDOMEN PELVIS W CONTRAST  Result Date: 03/30/2022 CLINICAL DATA:  Fever and diarrhea EXAM: CT ABDOMEN AND PELVIS WITH CONTRAST TECHNIQUE: Multidetector CT imaging of the abdomen and pelvis was performed using the standard protocol following bolus administration of intravenous contrast. RADIATION DOSE REDUCTION: This exam was performed according to the departmental dose-optimization program which includes automated exposure control, adjustment of the mA and/or kV according to patient size and/or use of iterative reconstruction technique. CONTRAST:  OMNIPAQUE IOHEXOL 300 MG/ML  SOLN COMPARISON:  None Available. FINDINGS: Lower chest: No acute abnormality. Hepatobiliary: Mild fatty infiltration of the liver is noted. The gallbladder is within normal limits. Pancreas: Unremarkable. No pancreatic ductal dilatation or surrounding inflammatory changes. Spleen: Normal in size without focal abnormality. Adrenals/Urinary Tract: Adrenal glands are within normal limits. Kidneys show a normal enhancement pattern. No renal calculi or obstructive changes are seen. The bladder is partially distended. Stomach/Bowel: Fluid is noted throughout the colon likely related to the diarrheal state. The appendix is well visualized and within normal limits. Small bowel and stomach are unremarkable. Vascular/Lymphatic: No significant vascular findings are present. No enlarged  abdominal or pelvic lymph nodes. Reproductive: Uterus and bilateral adnexa are unremarkable. Other: No abdominal wall hernia or abnormality. No abdominopelvic ascites. Musculoskeletal: No acute or significant osseous findings. IMPRESSION: Fluid within the colon consistent with the diarrheal state. No obstructive changes are seen. Fatty liver. No other focal abnormality is noted. Electronically Signed   By: Alcide Clever M.D.   On: 03/30/2022 23:08    Procedures Procedures    Medications Ordered in ED Medications  sodium chloride 0.9 % bolus 1,000 mL (has no administration in time range)  cefTRIAXone (ROCEPHIN) 1 g in sodium chloride 0.9 % 100 mL IVPB (0 g Intravenous Stopped 03/30/22 2240)  sodium chloride 0.9 % bolus 1,000 mL (1,000 mLs Intravenous New Bag/Given 03/30/22 2213)  ondansetron (ZOFRAN) injection 4 mg (4 mg Intravenous  Given 03/30/22 2214)  morphine (PF) 4 MG/ML injection 4 mg (4 mg Intravenous Given 03/30/22 2214)  potassium chloride SA (KLOR-CON M) CR tablet 40 mEq (40 mEq Oral Given 03/30/22 2255)  iohexol (OMNIPAQUE) 300 MG/ML solution 100 mL (100 mLs Intravenous Contrast Given 03/30/22 2255)    ED Course/ Medical Decision Making/ A&P    41 year old history of pyelonephritis here for evaluation of diarrhea, bilateral flank pain, dysuria and generalized abdominal pain.  States she has not been eating and drinking as much as she should.  Denies any fever, cough, sick contacts.  No recent antibiotics or travel.  Does look dehydrated.  She has bilateral CVA tenderness and generalized abdominal pain.  No concerns for STD.  No rashes or lesions, upper respiratory symptoms.  Labs and imaging personally viewed and interpreted:  CBC without leukocytosis, hemoglobin stable at 13.5 Metabolic panel hypokalemia, will replace, glucose 103 UA positive for UTI Lipase  28 [facility] diarrheal state without acute abnormality  Patient reassessed.  Received IV fluids, pain  management, nausea medication as well as IV antibiotics  Patient reassessed.  Feels mildly better.  Has some nausea still however no emesis.  She does not want additional antiemetics at this time.  She does look clinically dry.  Will write for additional IV fluids and p.o. challenge.  [facility] He will reassess after completed IV fluids and p.o. challenge.  If her pain is controlled, vital signs stable, tolerating PO patient may DC home Keflex, antiemetics.                          Medical Decision Making Amount and/or Complexity of Data Reviewed Independent Historian: spouse External Data Reviewed: labs, radiology, ECG and notes.    Details: Sent from UC, UA positive for infection. Given zofran and Tylenol PTA Labs: ordered. Decision-making details documented in ED Course. Radiology: ordered and independent interpretation performed. Decision-making details documented in ED Course.  Risk OTC drugs. Prescription drug management. Parenteral controlled substances. Decision regarding hospitalization. Diagnosis or treatment significantly limited by social determinants of health.          Final Clinical Impression(s) / ED Diagnoses Final diagnoses:  Pyelonephritis  Nausea vomiting and diarrhea    Rx / DC Orders ED Discharge Orders          Ordered    cephALEXin (KEFLEX) 500 MG capsule  3 times daily        03/30/22 2316    ondansetron (ZOFRAN) 4 MG tablet  Every 6 hours        03/30/22 2316    naproxen (NAPROSYN) 500 MG tablet  2 times daily        03/30/22 2316              Hensley Aziz A, PA-C 03/30/22 2330    Jacalyn LefevreHaviland, Julie, MD 03/30/22 2340

## 2022-03-30 NOTE — Discharge Instructions (Addendum)
It was a pleasure taking care of you here in the emergency department Your urine did show evidence of infection.  Given you have some flank pain you likely have with called pyelonephritis which is an infection in your kidney.  We have given you IV antibiotics here.  I have written a prescription for antibiotics, nausea medicine as this pain medicine for at home.  It is important that you rest, drink plenty of fluids.  You may also take Imodium for your diarrhea which is found over-the-counter.  Return for new or worsening symptoms.

## 2022-03-30 NOTE — ED Triage Notes (Signed)
Patient reports persistent diarrhea for 2 days and generalized abdominal pain yesterday with nausea , sent here from urgent care for IV hydration .

## 2022-03-31 NOTE — ED Provider Notes (Signed)
Received at shift change from Dana Wade [facility] please see her note for full detail  In short patient with significant medical history presents with complaints of will episode of loose stool as well as bilateral flank pain dysuria.  Per previous provider follow-up on patient after fluid resuscitation, as long as she is tolerant p.o. vital signs reassuring pain under control she may discharge home.  Lab work-which I personally reviewed and interpreted CBC unremarkable, CMP shows sodium of 133, potassium 2.9, CO2 of 21, glucose 103, calcium 8.2 total protein 6.4, lipase is 28, i-STAT hCG less than 5, UA shows nitrates leukocytes white blood cells or red blood cells bacteria present  CT abdomen pelvis-which I have personally reviewed and interpreted negative for acute findings  Reassessment  Patient is reassessed after second liter fluids, she is resting comfortably, having no complaints, she has received a gram of ceftriaxone, 40 mill equivalents of potassium, as well as pain medications.  She is resting calmly, vital signs remained stable, she is tolerating p.o.  She is ready for discharge.  Disposition  Pyelonephritis-discharged home on Keflex, follow-up with PCP as needed.    Marcello Fennel, PA-C 03/31/22 Clide Cliff, April, MD 03/31/22 [phone]

## 2022-03-31 NOTE — ED Notes (Signed)
Patient verbalizes understanding of discharge instructions. Opportunity for questioning and answers were provided. Armband removed by staff, pt discharged from ED ambulatory.   

## 2022-04-01 LAB — URINE CULTURE: Culture: >=100000 — AB

## 2022-07-25 ENCOUNTER — Other Ambulatory Visit: Payer: Self-pay | Admitting: Obstetrics and Gynecology

## 2022-07-25 DIAGNOSIS — Z1231 Encounter for screening mammogram for malignant neoplasm of breast: Secondary | ICD-10-CM

## 2022-08-13 ENCOUNTER — Ambulatory Visit (HOSPITAL_COMMUNITY)
Admission: EM | Admit: 2022-08-13 | Discharge: 2022-08-13 | Disposition: A | Discharge status: Home / Self Care | Payer: Self-pay | Attending: Internal Medicine | Admitting: Internal Medicine

## 2022-08-13 ENCOUNTER — Other Ambulatory Visit: Payer: Self-pay

## 2022-08-13 DIAGNOSIS — R42 Dizziness and giddiness: Secondary | ICD-10-CM

## 2022-08-13 DIAGNOSIS — S161XXA Strain of muscle, fascia and tendon at neck level, initial encounter: Secondary | ICD-10-CM

## 2022-08-13 DIAGNOSIS — R002 Palpitations: Secondary | ICD-10-CM

## 2022-08-13 MED ORDER — IBUPROFEN 800 MG PO TABS
ORAL_TABLET | ORAL | Status: AC
  Filled 2022-08-13: qty 1

## 2022-08-13 MED ORDER — IBUPROFEN 800 MG PO TABS
800.0000 mg | ORAL_TABLET | Freq: Once | ORAL | Status: AC
  Administered 2022-08-13: 800 mg via ORAL

## 2022-08-13 NOTE — ED Triage Notes (Signed)
PT reports this morning the right side of her neck started to hurt and pain radiates down the Rt side of her body to her Rt leg. Pt reports both hands turned purple ( Hands are normal color in triage). Pt also felt her heart beating and felt dizzy.

## 2022-08-13 NOTE — ED Provider Notes (Signed)
[facility]    CSN: [id number] Arrival date & time: 08/13/22  1003      History   Chief Complaint Chief Complaint  Patient presents with   Neck Pain    HPI Dana Wade is a 41 y.o. female.   Patient presents to urgent care for evaluation of 1 week history of atraumatic neck pain to the right side of her neck that is worsened by head movement. Neck pain is currently an 8 on a scale of 0-10 and radiates to the right arm.  Pain to the neck is worse with head movement to the right.  She sleeps on her left side and denies headache, blurry vision, decreased visual acuity, URI symptoms, and difficulty breathing.  No history of previous neck injury or surgeries to the cervical spine.  She also reports 1 episode of dizziness with associated heart palpitations that lasted approximately 5 minutes that happened this morning while she was at rest.  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  She reports experiencing numbness, tingling, and slight color change to the bilateral hands during the episode of palpitation/dizziness that lasted only a few seconds and then returned to normal.  Dizziness and heart palpitations improved significantly after deep breathing and attempting to "relax".  Denies chest pain, shortness of breath, nausea, left arm pain, abdominal pain, vomiting, and syncope during episode of palpitations this morning.  She is not diabetic and had eaten breakfast this morning when episode happened.  She does drink coffee but only had 1/2 cup of coffee this morning.  She admits to not drinking enough water throughout the day and states that she has been trying to be better about this.  She is very concerned since she has never experienced these symptoms in the past.  She is currently slightly dizzy and states that she feels "tipsy".  No chest pain at the moment.  She has no attempted use of any over-the-counter  medications prior to arrival urgent care for any of her symptoms.   Neck Pain   Past Medical History:  Diagnosis Date   Infection    kidney infection 4-5 years ago    Patient Active Problem List   Diagnosis Date Noted   Postpartum care following cesarean delivery 04/03/2019   Personal history of previous postdates pregnancy 02/21/2019   Unwanted fertility 02/21/2019   Breech presentation delivered 02/21/2019   Cesarean delivery delivered 02/21/2019   Language barrier 01/09/2017   Advanced maternal age in multigravida     Past Surgical History:  Procedure Laterality Date   CESAREAN SECTION N/A 02/21/2019   Procedure: CESAREAN SECTION;  Surgeon: Delbarton Bing, MD;  Location: [facility];  Service: Obstetrics;  Laterality: N/A;   DILATION AND EVACUATION N/A 12/31/2015   Procedure: DILATATION AND EVACUATION;  Surgeon: Tereso Newcomer, MD;  Location: [facility];  Service: Gynecology;  Laterality: N/A;   INCISION AND DRAINAGE POSTERIOR SACRAL SPINE  2006   fell while pregnant, had large hematoma    OB History     Gravida  4   Para  2   Term  2   Preterm  0   AB  1   Living  2      SAB  1   IAB  0   Ectopic  0   Multiple  0   Live Births  2  Home Medications    Prior to Admission medications   Medication Sig Start Date End Date Taking? Authorizing Provider  cephALEXin (KEFLEX) 500 MG capsule Take 1 capsule (500 mg total) by mouth 3 (three) times daily. 03/30/22   Henderly, Britni A, PA-C  naproxen (NAPROSYN) 500 MG tablet Take 1 tablet (500 mg total) by mouth 2 (two) times daily. 03/30/22   Henderly, Britni A, PA-C  ondansetron (ZOFRAN ODT) 4 MG disintegrating tablet Take 1 tablet (4 mg total) by mouth every 8 (eight) hours as needed for nausea or vomiting. Patient not taking: Reported on 03/30/2022 09/21/21   Hughie Closs, PA-C  ondansetron (ZOFRAN) 4 MG tablet Take 1 tablet (4 mg total) by mouth every 6 (six) hours. 03/30/22   Henderly,  Britni A, PA-C  Prenatal Vit-Fe Fumarate-FA (PRENATAL VITAMIN PO) Take by mouth.    [provider]    Family History Family History  Problem Relation Age of Onset   Hypertension Father     Social History Social History   Tobacco Use   Smoking status: Never   Smokeless tobacco: Never  Vaping Use   Vaping Use: Never used  Substance Use Topics   Alcohol use: No   Drug use: Never     Allergies   Adhesive [tape]   Review of Systems Review of Systems  Musculoskeletal:  Positive for neck pain.  Per HPI   Physical Exam Triage Vital Signs ED Triage Vitals  Enc Vitals Group     BP 08/13/22 1012 123/77     Pulse Rate 08/13/22 1012 82     Resp 08/13/22 1012 18     Temp 08/13/22 1012 98.3 F (36.8 C)     Temp src --      SpO2 08/13/22 1012 100 %     Weight --      Height --      Head Circumference --      Peak Flow --      Pain Score 08/13/22 1009 8     Pain Loc --      Pain Edu? --      Excl. in [place]? --    No data found.  Updated Vital Signs BP 123/77   Pulse 82   Temp 98.3 F (36.8 C)   Resp 18   LMP 07/31/2022   SpO2 100%   Visual Acuity Right Eye Distance:   Left Eye Distance:   Bilateral Distance:    Right Eye Near:   Left Eye Near:    Bilateral Near:     Physical Exam Vitals and nursing note reviewed.  Constitutional:      Appearance: She is obese. She is not ill-appearing or toxic-appearing.  HENT:     Head: Normocephalic and atraumatic.     Right Ear: Hearing, tympanic membrane, ear canal and external ear normal.     Left Ear: Hearing, tympanic membrane, ear canal and external ear normal.     Nose: Nose normal.     Mouth/Throat:     Lips: Pink.     Mouth: Mucous membranes are moist.     Pharynx: No posterior oropharyngeal erythema.  Eyes:     General: Lids are normal. Vision grossly intact. Gaze aligned appropriately.        Right eye: No discharge.        Left eye: No discharge.     Extraocular Movements: Extraocular  movements intact.     Conjunctiva/sclera: Conjunctivae normal.  Pupils: Pupils are equal, round, and reactive to light.     Comments: EOMs are normal and without dizziness/pain elicited.  Cardiovascular:     Rate and Rhythm: Normal rate and regular rhythm.     Heart sounds: Normal heart sounds, S1 normal and S2 normal.  Pulmonary:     Effort: Pulmonary effort is normal. No respiratory distress.     Breath sounds: Normal breath sounds and air entry.  Musculoskeletal:     Cervical back: Neck supple. Tenderness present.     Comments: Cervical spine: Mild TTP of the right-sided paraspinals of the cervical spine.  No obvious deformity, sign of injury, ecchymosis, or swelling present.  Range of motion to the neck is normal despite tenderness.  5/5 strength against resistance with bilateral head movement.  Sensation is intact to the head, upper extremities, and lower extremities bilaterally.  Lymphadenopathy:     Cervical: No cervical adenopathy.  Skin:    General: Skin is warm and dry.     Capillary Refill: Capillary refill takes less than 2 seconds.     Findings: No rash.  Neurological:     General: No focal deficit present.     Mental Status: She is alert and oriented to person, place, and time. Mental status is at baseline.     Cranial Nerves: Cranial nerves 2-12 are intact. No cranial nerve deficit, dysarthria or facial asymmetry.     Sensory: Sensation is intact. No sensory deficit.     Motor: Motor function is intact. No weakness.     Coordination: Coordination is intact. Coordination normal.     Gait: Gait normal.     Comments: Nonfocal neuro exam.  She is neurologically intact at baseline.  Psychiatric:        Mood and Affect: Mood normal.        Speech: Speech normal.        Behavior: Behavior normal.        Thought Content: Thought content normal.        Judgment: Judgment normal.      UC Treatments / Results  Labs (all labs ordered are listed, but only abnormal results  are displayed) Labs Reviewed - No data to display  EKG   Radiology No results found.  Procedures Procedures (including critical care time)  Medications Ordered in UC Medications  ibuprofen (ADVIL) tablet 800 mg (800 mg Oral Given 08/13/22 1119)    Initial Impression / Assessment and Plan / UC Course  I have reviewed the triage vital signs and the nursing notes.  Pertinent labs & imaging results that were available during my care of the patient were reviewed by me and considered in my medical decision making (see chart for details).   1.  Palpitations and dizziness Unknown etiology of patient's episodic palpitations and dizziness.  EKG shows normal sinus rhythm with a ventricular rate of 80 bpm without ST changes concerning for acute cardiac cause of palpitations/dizziness.  Advised patient to return to urgent care for reevaluation if she experiences similar symptoms in the future with associated nausea, vomiting, chest pain, shortness of breath, or any new or worsening symptoms.  Vital signs are hemodynamically stable in the clinic with a stable cardiopulmonary exam.  Therefore, there is no indication to refer to a higher level of care for further evaluation at this time.  Advised patient to increase water intake to at least 64 ounces of water per day to stay well-hydrated as symptoms may be related to dehydration.  Advised  to limit caffeine intake to 1 cup of coffee per day to prevent heart palpitations in the future.  Patient expresses agreement with plan.   2.  Strain of neck muscle Patient's neck pain is consistent with cervical strain etiology and will likely respond well to Tylenol, ibuprofen, and heat/gentle range of motion exercises.  She may use Tylenol 1000 mg and ibuprofen 600 mg every 6 hours as needed for pain and inflammation to the right side of her neck.  Heat and gentle range of motion exercises advised.  Ibuprofen given in the clinic today to help reduce pain and  inflammation to the neck.  Deferred imaging based on stable musculoskeletal exam findings and normal range of motion to exam.  Patient is also agreeable with this plan as well.  Discussed physical exam and available lab work findings in clinic with patient.  Counseled patient regarding appropriate use of medications and potential side effects for all medications recommended or prescribed today. Discussed red flag signs and symptoms of worsening condition,when to call the PCP office, return to urgent care, and when to seek higher level of care in the emergency department. Patient verbalizes understanding and agreement with plan. All questions answered. Patient discharged in stable condition.     Final Clinical Impressions(s) / UC Diagnoses   Final diagnoses:  Palpitations  Dizziness  Strain of neck muscle, initial encounter     Discharge Instructions      You have been evaluated in the today for your neck pain. Your pain is most likely muscle strain which will improve on its own with time.   Take ibuprofen 600 mg every 6 hours with food as needed for neck pain.  You may also take Tylenol 1000 mg every 6 hours as needed.  Do not take any other NSAID containing medications while taking ibuprofen such as naproxen, Aleve, meloxicam, etc.  Apply heat and perform gentle range of motion exercises to the area of greatest pain to prevent muscle stiffness and provide further pain relief.   Increase your water intake to at least 64 ounces of water per day to stay well-hydrated and only drink 1 cup of coffee per day to prevent heart palpitations.  If you experience these heart palpitations again with associated chest pain, shortness of breath, dizziness, or nausea, I would like for you to return to urgent care or go to the emergency room if your symptoms are severe.  Your work-up here today is normal and is not concerning for a cardiac cause of your heart palpitations.   Follow-up with your primary  care provider or return to urgent care if your symptoms do not improve in the next 3 to 4 days with medications and interventions recommended today.  If you develop any new or worsening symptoms, please return to urgent care.  If your symptoms are severe, please go to the emergency room.  I hope you feel better!      ED Prescriptions   None    PDMP not reviewed this encounter.   Carlisle Beers, [place] 08/13/22 2047

## 2022-08-13 NOTE — Discharge Instructions (Signed)
You have been evaluated in the today for your neck pain. Your pain is most likely muscle strain which will improve on its own with time.   Take ibuprofen 600 mg every 6 hours with food as needed for neck pain.  You may also take Tylenol 1000 mg every 6 hours as needed.  Do not take any other NSAID containing medications while taking ibuprofen such as naproxen, Aleve, meloxicam, etc.  Apply heat and perform gentle range of motion exercises to the area of greatest pain to prevent muscle stiffness and provide further pain relief.   Increase your water intake to at least 64 ounces of water per day to stay well-hydrated and only drink 1 cup of coffee per day to prevent heart palpitations.  If you experience these heart palpitations again with associated chest pain, shortness of breath, dizziness, or nausea, I would like for you to return to urgent care or go to the emergency room if your symptoms are severe.  Your work-up here today is normal and is not concerning for a cardiac cause of your heart palpitations.   Follow-up with your primary care provider or return to urgent care if your symptoms do not improve in the next 3 to 4 days with medications and interventions recommended today.  If you develop any new or worsening symptoms, please return to urgent care.  If your symptoms are severe, please go to the emergency room.  I hope you feel better!

## 2022-08-17 ENCOUNTER — Ambulatory Visit
Admission: RE | Admit: 2022-08-17 | Discharge: 2022-08-17 | Disposition: A | Discharge status: Home / Self Care | Payer: No Typology Code available for payment source | Source: Ambulatory Visit | Attending: Obstetrics and Gynecology | Admitting: Obstetrics and Gynecology

## 2022-08-17 ENCOUNTER — Ambulatory Visit: Payer: Self-pay | Admitting: Hematology and Oncology

## 2022-08-17 VITALS — BP 103/75 | Wt 174.4 lb

## 2022-08-17 DIAGNOSIS — Z1231 Encounter for screening mammogram for malignant neoplasm of breast: Secondary | ICD-10-CM

## 2022-08-17 NOTE — Progress Notes (Signed)
Ms. AMALIA EDGECOMBE is a 41 y.o. female who presents to [facility] clinic today with no complaints .    Pap Smear: Pap not smear completed today. Last Pap smear was 2014 at [facility] clinic and was normal. Per patient has no history of an abnormal Pap smear. Last Pap smear result is available in Epic.   Physical exam: Breasts Breasts symmetrical. No skin abnormalities bilateral breasts. No nipple retraction bilateral breasts. No nipple discharge bilateral breasts. No lymphadenopathy. No lumps palpated bilateral breasts.       Pelvic/Bimanual Pap is not indicated today    Smoking History: Patient has never smoked and was not referred to quit line.    Patient Navigation: Patient education provided. Access to services provided for patient through [place] interpreter provided. No transportation provided   Colorectal Cancer Screening: Per patient has never had colonoscopy completed No complaints today.    Breast and Cervical Cancer Risk Assessment: Patient does not have family history of breast cancer, known genetic mutations, or radiation treatment to the chest before age 89. Patient does not have history of cervical dysplasia, immunocompromised, or DES exposure in-utero.  Risk Assessment     Risk Scores       08/17/2022   Last edited by: Royston Bake, CMA   5-year risk: 0.4 %   Lifetime risk: 6.3 %            A: BCCCP exam without pap smear No complaints with benign exam. She is on her period today and would like her pap smear rescheduled.   P: Referred patient to the [facility] for a screening mammogram. Appointment scheduled 08/17/2022.  Dayton Scrape A, NP 08/17/2022 10:52 AM

## 2022-08-21 ENCOUNTER — Other Ambulatory Visit: Payer: Self-pay | Admitting: Obstetrics and Gynecology

## 2022-08-21 DIAGNOSIS — R928 Other abnormal and inconclusive findings on diagnostic imaging of breast: Secondary | ICD-10-CM

## 2022-08-29 ENCOUNTER — Ambulatory Visit
Admission: RE | Admit: 2022-08-29 | Discharge: 2022-08-29 | Disposition: A | Discharge status: Home / Self Care | Payer: No Typology Code available for payment source | Source: Ambulatory Visit | Attending: Obstetrics and Gynecology | Admitting: Obstetrics and Gynecology

## 2022-08-29 DIAGNOSIS — R928 Other abnormal and inconclusive findings on diagnostic imaging of breast: Secondary | ICD-10-CM

## 2022-09-07 ENCOUNTER — Other Ambulatory Visit: Payer: Self-pay | Admitting: *Deleted

## 2022-09-07 DIAGNOSIS — Z124 Encounter for screening for malignant neoplasm of cervix: Secondary | ICD-10-CM

## 2022-09-07 NOTE — Progress Notes (Addendum)
Patient: Dana Wade           Date of Birth: 1981-07-27           MRN: [id number] Visit Date: 09/07/2022 PCP: Patient, No Pcp Per  Cervical Cancer Screening Do you smoke?: No Have you ever had or been told you have an allergy to latex products?: No Marital status: Single Date of last pap smear: More than 5 yrs ago (04/14/13 (negative)) Date of last menstrual period:  (September 2023) Number of pregnancies: 3 Number of births: 3 Have you ever had any of the following? Hysterectomy: No Tubal ligation (tubes tied): Yes Abnormal bleeding: No Abnormal pap smear: No Venereal warts: No A sex partner with venereal warts: No A high risk* sex partner: No  Cervical Exam  Abnormal Observations: Thick white yeast appearing discharge in vagina and dried white discharge observed on external genitalia. Wet prep completed. Patient had blood at her cervical os consistent with the start of her menstrual period. Patient stated she is due for her menstrual period. Recommendations: Last Pap smear was 04/14/2013 at [facility] [place] [facility] and was normal. Per patient has no history of an abnormal Pap smear. Last Pap smear result is available in Epic. Let patient know if today's Pap smear is normal and HPV negative that her next Pap smear is due in 5 years. Informed patient will follow up with her within the next week with results of her wet prep and Pap smear by phone. Patient verbalized understanding.     Spanish interpreter Dana Wade from [facility] provided.  Patient's History Patient Active Problem List   Diagnosis Date Noted   Postpartum care following cesarean delivery 04/03/2019   Personal history of previous postdates pregnancy 02/21/2019   Unwanted fertility 02/21/2019   Breech presentation delivered 02/21/2019   Cesarean delivery delivered 02/21/2019   Language barrier 01/09/2017   Advanced maternal age in multigravida    Past Medical History:  Diagnosis  Date   Infection    kidney infection 4-5 years ago    Family History  Problem Relation Age of Onset   Hypertension Father     Social History   Occupational History   Not on file  Tobacco Use   Smoking status: Never   Smokeless tobacco: Never  Vaping Use   Vaping Use: Never used  Substance and Sexual Activity   Alcohol use: No   Drug use: Never   Sexual activity: Yes    Birth control/protection: Surgical

## 2022-09-08 ENCOUNTER — Telehealth: Payer: Self-pay

## 2022-09-08 ENCOUNTER — Other Ambulatory Visit: Payer: Self-pay

## 2022-09-08 LAB — CERVICOVAGINAL ANCILLARY ONLY
Bacterial Vaginitis (gardnerella): POSITIVE — AB
Candida Glabrata: NEGATIVE
Candida Vaginitis: NEGATIVE
Comment: NEGATIVE
Comment: NEGATIVE
Comment: NEGATIVE
Comment: NEGATIVE
Trichomonas: NEGATIVE

## 2022-09-08 LAB — CYTOLOGY - PAP
Comment: NEGATIVE
Diagnosis: NEGATIVE
High risk HPV: NEGATIVE

## 2022-09-08 MED ORDER — METRONIDAZOLE 500 MG PO TABS
500.0000 mg | ORAL_TABLET | Freq: Two times a day (BID) | ORAL | 0 refills | Status: DC
Start: 2022-09-08 — End: 2023-02-20

## 2022-09-08 NOTE — Telephone Encounter (Signed)
Error

## 2022-09-08 NOTE — Progress Notes (Signed)
Error

## 2022-09-08 NOTE — Telephone Encounter (Signed)
Via Lavon Paganini, Spanish Interpeter, Patient informed negative Pap/HPV results, repeat pap smear in 5 years, Wet prep-positive for bacterial vaginitis, needs rx metronidazole, twice daily for 7 days, and avoid alcohol. Pharmacy: [facility].

## 2022-09-08 NOTE — Telephone Encounter (Deleted)
Via Lavon Paganini, Spanish Interpreter [facility]) Patient informed wet prep results, positive for yeast infection, needs rx Diflucan (1 tablet). [facility]: [facility].

## 2023-02-19 ENCOUNTER — Other Ambulatory Visit (HOSPITAL_COMMUNITY)
Admission: RE | Admit: 2023-02-19 | Discharge: 2023-02-19 | Disposition: A | Discharge status: Home / Self Care | Payer: Self-pay | Source: Ambulatory Visit | Attending: Family Medicine | Admitting: Family Medicine

## 2023-02-19 ENCOUNTER — Encounter: Payer: Self-pay | Admitting: Family Medicine

## 2023-02-19 ENCOUNTER — Ambulatory Visit (INDEPENDENT_AMBULATORY_CARE_PROVIDER_SITE_OTHER): Payer: Self-pay | Admitting: Family Medicine

## 2023-02-19 VITALS — BP 127/83 | HR 121 | Ht 63.0 in | Wt 178.6 lb

## 2023-02-19 DIAGNOSIS — R109 Unspecified abdominal pain: Secondary | ICD-10-CM | POA: Insufficient documentation

## 2023-02-19 DIAGNOSIS — N83201 Unspecified ovarian cyst, right side: Secondary | ICD-10-CM

## 2023-02-19 DIAGNOSIS — Z98891 History of uterine scar from previous surgery: Secondary | ICD-10-CM | POA: Insufficient documentation

## 2023-02-19 NOTE — Patient Instructions (Addendum)
Ultrasound Appointment on Thursday, April 11th at [facility]. Please arrive at 1:45 PM with a full bladder (so drink about 32 oz of water before coming in for the appointment).  Location: [address] [place], [place] [address] Phone: [phone] or [phone]

## 2023-02-19 NOTE — Progress Notes (Signed)
   GYNECOLOGY PROBLEM  VISIT ENCOUNTER NOTE  Subjective:   Dana Wade is a 42 y.o. [phone] female here for a problem GYN visit.  Chief Complaint  Patient presents with   Abdominal Pain    Patient describes as intermittent abdominal pain. Had prior to her last delivery but then it went away but since her last CS in 2020 the pain has been more persistent. Causes to her stop and is severe at 10/10 when happening. Locates the pain in lower abdomen along CS scar.  Reports some minor dysuria today.  She says she will have multiple days without pain. The pain is not associated with menses. The pain is happening more frequently now. Reports at least weekly. She reports she used to take tylenol and ibuprofen for the pain which would alleviate it but that is no longer working. Bending and changing position makes the pain worse.  She reports pain with sex and noted some spotting. She has regular monthly periods and has a BTL for pregnancy prevention. Her pap is UTD.   Denies abnormal vaginal bleeding, discharge, pelvic pain, problems with intercourse or other gynecologic concerns.    Gynecologic History Patient's last menstrual period was 01/29/2023 (exact date).  Contraception: tubal ligation  Health Maintenance Due  Topic Date Due   COVID-19 Vaccine (1) Never done   Hepatitis C Screening  Never done    The following portions of the patient's history were reviewed and updated as appropriate: allergies, current medications, past family history, past medical history, past social history, past surgical history and problem list.  Review of Systems Pertinent items are noted in HPI.   Objective:  BP 127/83   Pulse (!) 121   Ht 5\' 3"  (1.6 m)   Wt 178 lb 9.6 oz (81 kg)   LMP 01/29/2023 (Exact Date) Comment: lasted about 3-4 days; heavy to light bleeding  BMI 31.64 kg/m  Gen: well appearing, NAD HEENT: no scleral icterus CV: RR Lung: Normal WOB Ext: warm well perfused  PELVIC:  Normal appearing external genitalia; normal appearing vaginal mucosa and cervix.  Mild yellowish discharge noted at the cervical os.  Normal uterine size, no other palpable masses, no uterine or adnexal tenderness. Mild TTP over suprapubic area.    Assessment and Plan:   1. H/O cesarean section CSx1  2. Abdominal pain, unspecified abdominal location DX includes infection, scar tissue/adhesive disease, ovarian cyst, normal variant, endometriosis.  - Will get further testing to evaluate - Cervicovaginal ancillary only( [place]) - Urine Culture - Urinalysis, Routine w reflex microscopic - US PELVIC COMPLETE WITH TRANSVAGINAL; Future    Please refer to After Visit Summary for other counseling recommendations.   No follow-ups on file.  Federico Flake, MD, MPH, ABFM Attending Physician Faculty Practice- [facility] for [facility]

## 2023-02-20 LAB — CERVICOVAGINAL ANCILLARY ONLY
Bacterial Vaginitis (gardnerella): POSITIVE — AB
Candida Glabrata: NEGATIVE
Candida Vaginitis: NEGATIVE
Chlamydia: NEGATIVE
Comment: NEGATIVE
Comment: NEGATIVE
Comment: NEGATIVE
Comment: NEGATIVE
Comment: NEGATIVE
Comment: NORMAL
Neisseria Gonorrhea: NEGATIVE
Trichomonas: NEGATIVE

## 2023-02-20 LAB — URINALYSIS, ROUTINE W REFLEX MICROSCOPIC
Bilirubin, UA: NEGATIVE
Glucose, UA: NEGATIVE
Ketones, UA: NEGATIVE
Nitrite, UA: NEGATIVE
Protein,UA: NEGATIVE
RBC, UA: NEGATIVE
Specific Gravity, UA: 1.028 (ref 1.005–1.030)
Urobilinogen, Ur: 0.2 mg/dL (ref 0.2–1.0)
pH, UA: 5.5 (ref 5.0–7.5)

## 2023-02-20 LAB — MICROSCOPIC EXAMINATION: Casts: NONE SEEN /lpf

## 2023-02-20 MED ORDER — METRONIDAZOLE 500 MG PO TABS: 500.0000 mg | ORAL_TABLET | Freq: Two times a day (BID) | ORAL | 0 refills | Status: DC

## 2023-02-20 NOTE — Addendum Note (Signed)
Addended by: Geanie Berlin on: 02/20/2023 09:52 PM   Modules accepted: Orders

## 2023-02-21 ENCOUNTER — Telehealth: Payer: Self-pay

## 2023-02-21 LAB — URINE CULTURE

## 2023-02-21 NOTE — Telephone Encounter (Addendum)
-----   Message from Federico Flake, MD sent at 02/20/2023  9:52 PM EDT ----- BV present. Metronidazole sent to pharmacy. Please call to review results and prescription.   Called pt with Spanish Interpreter Eda R., and left message to return call back.   Leonette Nutting  02/21/23

## 2023-02-22 ENCOUNTER — Ambulatory Visit (HOSPITAL_COMMUNITY)
Admission: RE | Admit: 2023-02-22 | Discharge: 2023-02-22 | Disposition: A | Discharge status: Home / Self Care | Payer: Self-pay | Source: Ambulatory Visit | Attending: Family Medicine | Admitting: Family Medicine

## 2023-02-22 DIAGNOSIS — R109 Unspecified abdominal pain: Secondary | ICD-10-CM | POA: Insufficient documentation

## 2023-02-22 NOTE — Telephone Encounter (Signed)
Patient left a message 02/21/23 pm that she is returning our call. I called Dana Wade with Interpreter Dana Wade and informed her that her wet prep shows BV and that we have sent a prescription to her pharmacy. She states she has already picked it up. I reviewed instructions on taking the medication with her- to take with food until all gone, and no alcohol while on medication.She voices understanding. [name]

## 2023-03-09 NOTE — Addendum Note (Signed)
Addended by: Geanie Berlin on: 03/09/2023 02:34 PM   Modules accepted: Orders

## 2023-03-13 ENCOUNTER — Telehealth: Payer: Self-pay

## 2023-03-13 NOTE — Telephone Encounter (Addendum)
-----   Message from Federico Flake, MD sent at 03/09/2023  2:34 PM EDT ----- Patient has a right ovarian cyst. I recommend a repeat US in about 8 week to see if this is resolving.  If it is not we can refer her to one of the surgeons to consider removal. I have put in the order. Can you assure it was scheduled?  Per Dr. Alvester Morin, pt U/S appt tomorrow needs to be rescheduled to 8 weeks from now.  Left message to call office about appt.   Leonette Nutting  03/13/23

## 2023-03-14 ENCOUNTER — Ambulatory Visit (HOSPITAL_BASED_OUTPATIENT_CLINIC_OR_DEPARTMENT_OTHER): Payer: Self-pay

## 2023-03-14 NOTE — Telephone Encounter (Signed)
Called pt and patient returned call. Front office reviewed recommendation with patient. Appt rescheduled for 05/04/23 @ 1 PM.

## 2023-04-24 ENCOUNTER — Ambulatory Visit (INDEPENDENT_AMBULATORY_CARE_PROVIDER_SITE_OTHER): Payer: Self-pay

## 2023-04-24 ENCOUNTER — Other Ambulatory Visit: Payer: Self-pay

## 2023-04-24 ENCOUNTER — Ambulatory Visit (INDEPENDENT_AMBULATORY_CARE_PROVIDER_SITE_OTHER): Payer: Self-pay | Admitting: Family Medicine

## 2023-04-24 VITALS — BP 128/86 | HR 83 | Ht 63.0 in | Wt 182.0 lb

## 2023-04-24 DIAGNOSIS — G8929 Other chronic pain: Secondary | ICD-10-CM

## 2023-04-24 DIAGNOSIS — M25562 Pain in left knee: Secondary | ICD-10-CM

## 2023-04-24 NOTE — Progress Notes (Unsigned)
Dana Payor, PhD, LAT, ATC acting as a scribe for [name], MD.  Dana Wade Dana Wade Dana Wade is a 42 y.o. female who presents to [facility] at [facility] today for L knee pain ongoing for about 3 wks. MOI: At work, she was going down the stairs and felt a pain in her L knee. She works at [employer] in housekeeping. Pt locates pain to the anterior aspect of her L knee.  L Knee swelling: yes Mechanical symptoms: yes Aggravates: stairs,  Treatments tried: IBU  Pertinent review of systems: No fevers or chills  Relevant historical information: Otherwise healthy   Exam:  BP 128/86   Pulse 83   Ht 5\' 3"  (1.6 m)   Wt 182 lb (82.6 kg)   SpO2 98%   BMI 32.24 kg/m  General: Well Developed, well nourished, and in no acute distress.   MSK: Left knee normal appearing. Normal motion. Nontender. Stable ligamentous Exam. Intact strength some pain with resisted knee extension is present.    Lab and Radiology Results  Procedure: Real-time Ultrasound Guided Injection of left knee joint superior lateral patellar space Device: Philips Affiniti 50G Images permanently stored and available for review in PACS Ultrasound evaluation prior to injection reveals minimal joint effusion.  Intact patellar and quad tendons.  No significant vascular activity present at patellar tendon. Medial joint line slightly narrowed.  Posterior knee normal-appearing. Verbal informed consent obtained.  Discussed risks and benefits of procedure. Warned about infection, bleeding, hyperglycemia damage to structures among others. Patient expresses understanding and agreement Time-out conducted.   Noted no overlying erythema, induration, or other signs of local infection.   Skin prepped in a sterile fashion.   Local anesthesia: Topical Ethyl chloride.   With sterile technique and under real time ultrasound guidance: 40 mg of Kenalog and 2 mL of Marcaine injected into knee joint. Fluid seen entering  the joint capsule.   Completed without difficulty   Pain immediately resolved suggesting accurate placement of the medication.   Advised to call if fevers/chills, erythema, induration, drainage, or persistent bleeding.   Images permanently stored and available for review in the ultrasound unit.  Impression: Technically successful ultrasound guided injection.    X-ray images left knee obtained today personally and independently interpreted Mild medial compartment DJD.  No acute fractures are visible. Await formal radiology review   Assessment and Plan: 42 y.o. female with left knee pain ongoing for a few weeks to a few months.  Pain is primarily anterior knee thought to be patellofemoral pain syndrome perhaps patellar tendinitis.  She does have some degenerative changes as well.  Plan for steroid injection and physical therapy referral.  Recheck in 6 weeks.   PDMP not reviewed this encounter. Orders Placed This Encounter  Procedures   [place] LIMITED JOINT SPACE STRUCTURES LOW LEFT(NO LINKED CHARGES)    Order Specific Question:   Reason for Exam (SYMPTOM  OR DIAGNOSIS REQUIRED)    Answer:   left knee pain    Order Specific Question:   Preferred imaging location?    Answer:   [facility] Knee AP/LAT W/[place] Left    Standing Status:   Future    Number of Occurrences:   1    Standing Expiration Date:   05/24/2023    Order Specific Question:   Reason for Exam (SYMPTOM  OR DIAGNOSIS REQUIRED)    Answer:   left knee pain    Order Specific Question:   Preferred  imaging location?    Answer:   [name]    Order Specific Question:   Is patient pregnant?    Answer:   No   Ambulatory referral to Physical Therapy    Referral Priority:   Routine    Referral Type:   Physical Medicine    Referral Reason:   Specialty Services Required    Requested Specialty:   Physical Therapy    Number of Visits Requested:   1   No orders of the defined types were  placed in this encounter.    Discussed warning signs or symptoms. Please see discharge instructions. Patient expresses understanding.   The above documentation has been reviewed and is accurate and complete [name], M.D.  Spanish interpreter used for today's visit.

## 2023-04-24 NOTE — Patient Instructions (Addendum)
Thank you for coming in today.   Please use Voltaren gel (Generic Diclofenac Gel) up to 4x daily for pain as needed.  This is available over-the-counter as both the name brand Voltaren gel and the generic diclofenac gel.   Please get an Xray today before you leave   You received an injection today. Seek immediate medical attention if the joint becomes red, extremely painful, or is oozing fluid.   I've referred you to Physical Therapy.  Let us know if you don't hear from them in one week.   Check back in 2 months

## 2023-04-30 NOTE — Progress Notes (Signed)
Left knee x-ray looks normal to radiology

## 2023-05-04 ENCOUNTER — Ambulatory Visit (HOSPITAL_BASED_OUTPATIENT_CLINIC_OR_DEPARTMENT_OTHER)
Admission: RE | Admit: 2023-05-04 | Discharge: 2023-05-04 | Disposition: A | Discharge status: Home / Self Care | Payer: Self-pay | Source: Ambulatory Visit | Attending: Family Medicine | Admitting: Family Medicine

## 2023-05-04 DIAGNOSIS — N83201 Unspecified ovarian cyst, right side: Secondary | ICD-10-CM | POA: Insufficient documentation

## 2023-05-04 DIAGNOSIS — R109 Unspecified abdominal pain: Secondary | ICD-10-CM | POA: Insufficient documentation

## 2023-05-10 ENCOUNTER — Telehealth: Payer: Self-pay

## 2023-05-10 NOTE — Telephone Encounter (Addendum)
-----   Message from Federico Flake, MD sent at 05/10/2023  2:13 PM EDT ----- US showed right ovarian cyst has resolved.    Called pt with interpreter Eda. Results reviewed.

## 2023-05-14 ENCOUNTER — Ambulatory Visit
Admission: EM | Admit: 2023-05-14 | Discharge: 2023-05-14 | Disposition: A | Discharge status: Home / Self Care | Payer: No Typology Code available for payment source

## 2023-05-14 DIAGNOSIS — R300 Dysuria: Secondary | ICD-10-CM

## 2023-05-14 DIAGNOSIS — N3001 Acute cystitis with hematuria: Secondary | ICD-10-CM

## 2023-05-14 HISTORY — DX: Dermatitis, unspecified: L30.9

## 2023-05-14 LAB — POCT URINALYSIS DIP (MANUAL ENTRY)
Bilirubin, UA: NEGATIVE
Glucose, UA: NEGATIVE mg/dL
Ketones, POC UA: NEGATIVE mg/dL
Nitrite, UA: NEGATIVE
Protein Ur, POC: NEGATIVE mg/dL
Spec Grav, UA: 1.02 (ref 1.010–1.025)
Urobilinogen, UA: 1 E.U./dL
pH, UA: 7 (ref 5.0–8.0)

## 2023-05-14 MED ORDER — CEPHALEXIN 500 MG PO CAPS: 500.0000 mg | ORAL_CAPSULE | Freq: Two times a day (BID) | ORAL | 0 refills | Status: DC

## 2023-05-14 NOTE — Discharge Instructions (Addendum)
Please start cephalexin to address an urinary tract infection. Make sure you hydrate very well with plain water and a quantity of 80 ounces of water a day.  Please limit drinks that are considered urinary irritants such as soda, sweet tea, coffee, energy drinks, alcohol.  These can worsen your urinary and genital symptoms but also be the source of them.  I will let you know about your urine culture results through MyChart to see if we need to prescribe or change your antibiotics based off of those results.  

## 2023-05-14 NOTE — ED Provider Notes (Signed)
[facility]  Note:  This document was prepared using Conservation officer, historic buildings and may include unintentional dictation errors.  MRN: [id number] DOB: 1981-07-12  Subjective:   Dana Wade is a 42 y.o. female presenting for 1 week history of persistent dysuria, urinary frequency and urgency.  Has been using ibuprofen with some relief.  Patient tries to hydrate very well but only does 2 bottles a day, also holds her urine.  Denies fever, nausea, vomiting, vaginal discharge.  Has a history of UTIs.  No concern for pregnancy.  No current facility-administered medications for this encounter.  Current Outpatient Medications:    Dupilumab (DUPIXENT [place]), Inject into the skin., Disp: , Rfl:    ibuprofen (ADVIL) 200 MG tablet, Take 200 mg by mouth every 6 (six) hours as needed., Disp: , Rfl:    metroNIDAZOLE (FLAGYL) 500 MG tablet, Take 1 tablet (500 mg total) by mouth 2 (two) times daily., Disp: 14 tablet, Rfl: 0   naproxen (NAPROSYN) 500 MG tablet, Take 1 tablet (500 mg total) by mouth 2 (two) times daily. (Patient not taking: Reported on 08/17/2022), Disp: 30 tablet, Rfl: 0   ondansetron (ZOFRAN ODT) 4 MG disintegrating tablet, Take 1 tablet (4 mg total) by mouth every 8 (eight) hours as needed for nausea or vomiting. (Patient not taking: Reported on 03/30/2022), Disp: 20 tablet, Rfl: 0   ondansetron (ZOFRAN) 4 MG tablet, Take 1 tablet (4 mg total) by mouth every 6 (six) hours. (Patient not taking: Reported on 08/17/2022), Disp: 12 tablet, Rfl: 0   Prenatal Vit-Fe Fumarate-FA (PRENATAL VITAMIN PO), Take by mouth. (Patient not taking: Reported on 08/17/2022), Disp: , Rfl:    Allergies  Allergen Reactions   Adhesive [Tape] Itching and Other (See Comments)    "steri strips" Blistering    Past Medical History:  Diagnosis Date   Dermatitis    Infection    kidney infection 4-5 years ago     Past Surgical History:  Procedure Laterality Date   CESAREAN  SECTION N/A 02/21/2019   Procedure: CESAREAN SECTION;  Surgeon: Taylor Bing, MD;  Location: [facility];  Service: Obstetrics;  Laterality: N/A;   DILATION AND EVACUATION N/A 12/31/2015   Procedure: DILATATION AND EVACUATION;  Surgeon: Tereso Newcomer, MD;  Location: [facility];  Service: Gynecology;  Laterality: N/A;   INCISION AND DRAINAGE POSTERIOR SACRAL SPINE  2006   fell while pregnant, had large hematoma   TUBAL LIGATION      Family History  Problem Relation Age of Onset   Hypertension Father     Social History   Tobacco Use   Smoking status: Never   Smokeless tobacco: Never  Vaping Use   Vaping Use: Never used  Substance Use Topics   Alcohol use: No   Drug use: Never    ROS   Objective:   Vitals: BP 122/84 (BP Location: Left Arm)   Pulse 63   Temp 98.8 F (37.1 C) (Oral)   Resp 18   LMP 03/31/2023 Comment: Hx of tubal ligation  SpO2 97%   Physical Exam Constitutional:      General: She is not in acute distress.    Appearance: Normal appearance. She is well-developed. She is not ill-appearing, toxic-appearing or diaphoretic.  HENT:     Head: Normocephalic and atraumatic.     Nose: Nose normal.     Mouth/Throat:     Mouth: Mucous membranes are moist.     Pharynx: Oropharynx is clear.  Eyes:     General: No scleral icterus.       Right eye: No discharge.        Left eye: No discharge.     Extraocular Movements: Extraocular movements intact.     Conjunctiva/sclera: Conjunctivae normal.  Cardiovascular:     Rate and Rhythm: Normal rate.  Pulmonary:     Effort: Pulmonary effort is normal.  Abdominal:     General: Bowel sounds are normal. There is no distension.     Palpations: Abdomen is soft. There is no mass.     Tenderness: There is no abdominal tenderness. There is no right CVA tenderness, left CVA tenderness, guarding or rebound.  Skin:    General: Skin is warm and dry.  Neurological:     General: No focal deficit present.     Mental Status:  She is alert and oriented to person, place, and time.  Psychiatric:        Mood and Affect: Mood normal.        Behavior: Behavior normal.        Thought Content: Thought content normal.        Judgment: Judgment normal.     Results for orders placed or performed during the hospital encounter of 05/14/23 (from the past 24 hour(s))  POCT urinalysis dipstick     Status: Abnormal   Collection Time: 05/14/23  5:12 PM  Result Value Ref Range   Color, UA yellow yellow   Clarity, UA clear clear   Glucose, UA negative negative mg/dL   Bilirubin, UA negative negative   Ketones, POC UA negative negative mg/dL   Spec Grav, UA 4.166 0.630 - 1.025   Blood, UA trace-lysed (A) negative   pH, UA 7.0 5.0 - 8.0   Protein Ur, POC negative negative mg/dL   Urobilinogen, UA 1.0 0.2 or 1.0 E.U./dL   Nitrite, UA Negative Negative   Leukocytes, UA Small (1+) (A) Negative    Assessment and Plan :   PDMP not reviewed this encounter.  1. Acute cystitis with hematuria   2. Dysuria    Start Keflex to cover for acute cystitis, urine culture pending.  Recommended aggressive hydration, limiting urinary irritants. Counseled patient on potential for adverse effects with medications prescribed/recommended today, ER and return-to-clinic precautions discussed, patient verbalized understanding.    Wallis Bamberg, [place] 05/14/23 1910

## 2023-05-14 NOTE — ED Triage Notes (Signed)
Pt reports she has burning when urinating x 1 week; low back pain x 2 days. Ibuprofen gives some relief.

## 2023-05-15 LAB — URINE CULTURE: Culture: <10000 — AB

## 2023-05-18 ENCOUNTER — Ambulatory Visit: Payer: Self-pay | Attending: Family Medicine

## 2023-05-18 ENCOUNTER — Other Ambulatory Visit: Payer: Self-pay

## 2023-05-18 ENCOUNTER — Ambulatory Visit (HOSPITAL_COMMUNITY)
Admission: EM | Admit: 2023-05-18 | Discharge: 2023-05-18 | Disposition: A | Discharge status: Home / Self Care | Payer: Self-pay | Attending: Family Medicine | Admitting: Family Medicine

## 2023-05-18 ENCOUNTER — Encounter (HOSPITAL_COMMUNITY): Payer: Self-pay | Admitting: Emergency Medicine

## 2023-05-18 DIAGNOSIS — G8929 Other chronic pain: Secondary | ICD-10-CM | POA: Insufficient documentation

## 2023-05-18 DIAGNOSIS — R300 Dysuria: Secondary | ICD-10-CM | POA: Insufficient documentation

## 2023-05-18 DIAGNOSIS — N3090 Cystitis, unspecified without hematuria: Secondary | ICD-10-CM | POA: Insufficient documentation

## 2023-05-18 DIAGNOSIS — M25562 Pain in left knee: Secondary | ICD-10-CM | POA: Insufficient documentation

## 2023-05-18 LAB — POCT URINALYSIS DIP (MANUAL ENTRY)
Bilirubin, UA: NEGATIVE
Blood, UA: NEGATIVE
Glucose, UA: NEGATIVE mg/dL
Nitrite, UA: NEGATIVE
Protein Ur, POC: NEGATIVE mg/dL
Spec Grav, UA: 1.025 (ref 1.010–1.025)
Urobilinogen, UA: 1 E.U./dL
pH, UA: 6 (ref 5.0–8.0)

## 2023-05-18 MED ORDER — PHENAZOPYRIDINE HCL 100 MG PO TABS: 100.0000 mg | ORAL_TABLET | Freq: Three times a day (TID) | ORAL | 0 refills | Status: DC | PRN

## 2023-05-18 MED ORDER — NITROFURANTOIN MONOHYD MACRO 100 MG PO CAPS: 100.0000 mg | ORAL_CAPSULE | Freq: Two times a day (BID) | ORAL | 0 refills | Status: AC

## 2023-05-18 NOTE — ED Provider Notes (Signed)
[facility]    CSN: [id number] Arrival date & time: 05/18/23  1412      History   Chief Complaint Chief Complaint  Patient presents with   Dysuria   Urinary Frequency    HPI Dana Wade is a 42 y.o. female.    Dysuria Urinary Frequency   Here for continued dysuria and incomplete bladder emptying.  Symptoms have been going on for about 2 weeks.  She was seen at [facility] and urinalysis was abnormal.  She has begun on Keflex.  Urine culture was negative with less than 10,000 colony count of multiple species.  She was asked to stop the antibiotics.  She did not improve while she was on those antibiotics   No fever or chills and no nausea or vomiting.  She has had trouble feeling like she can only urinate just a little bit.    Past Medical History:  Diagnosis Date   Dermatitis    Infection    kidney infection 4-5 years ago    Patient Active Problem List   Diagnosis Date Noted   H/O cesarean section 02/19/2023    Past Surgical History:  Procedure Laterality Date   CESAREAN SECTION N/A 02/21/2019   Procedure: CESAREAN SECTION;  Surgeon: [place] Bing, MD;  Location: [facility];  Service: Obstetrics;  Laterality: N/A;   DILATION AND EVACUATION N/A 12/31/2015   Procedure: DILATATION AND EVACUATION;  Surgeon: Tereso Newcomer, MD;  Location: [facility];  Service: Gynecology;  Laterality: N/A;   INCISION AND DRAINAGE POSTERIOR SACRAL SPINE  2006   fell while pregnant, had large hematoma   TUBAL LIGATION      OB History     Gravida  4   Para  3   Term  3   Preterm  0   AB  1   Living  3      SAB  1   IAB  0   Ectopic  0   Multiple  0   Live Births  3            Home Medications    Prior to Admission medications   Medication Sig Start Date End Date Taking? Authorizing Provider  nitrofurantoin, macrocrystal-monohydrate, (MACROBID) 100 MG capsule Take 1 capsule (100 mg total) by mouth 2 (two) times daily for 5 days.  05/18/23 05/23/23 Yes Jaidon Ellery, Janace Aris, MD  phenazopyridine (PYRIDIUM) 100 MG tablet Take 1 tablet (100 mg total) by mouth 3 (three) times daily as needed (urinary pain). 05/18/23  Yes Zenia Resides, MD    Family History Family History  Problem Relation Age of Onset   Hypertension Father     Social History Social History   Tobacco Use   Smoking status: Never   Smokeless tobacco: Never  Vaping Use   Vaping Use: Never used  Substance Use Topics   Alcohol use: No   Drug use: Never     Allergies   Adhesive [tape]   Review of Systems Review of Systems  Genitourinary:  Positive for dysuria and frequency.     Physical Exam Triage Vital Signs ED Triage Vitals  Enc Vitals Group     BP 05/18/23 1531 106/72     Pulse Rate 05/18/23 1531 73     Resp 05/18/23 1531 16     Temp 05/18/23 1531 97.9 F (36.6 C)     Temp Source 05/18/23 1531 Oral     SpO2 05/18/23 1531 97 %  Weight --      Height --      Head Circumference --      Peak Flow --      Pain Score 05/18/23 1529 4     Pain Loc --      Pain Edu? --      Excl. in GC? --    No data found.  Updated Vital Signs BP 106/72 (BP Location: Left Arm)   Pulse 73   Temp 97.9 F (36.6 C) (Oral)   Resp 16   LMP 05/08/2023 (Approximate) Comment: Hx of tubal ligation  SpO2 97%   Visual Acuity Right Eye Distance:   Left Eye Distance:   Bilateral Distance:    Right Eye Near:   Left Eye Near:    Bilateral Near:     Physical Exam Vitals reviewed.  Constitutional:      General: She is not in acute distress.    Appearance: She is not ill-appearing, toxic-appearing or diaphoretic.  HENT:     Mouth/Throat:     Mouth: Mucous membranes are moist.  Cardiovascular:     Rate and Rhythm: Normal rate and regular rhythm.     Heart sounds: No murmur heard. Pulmonary:     Effort: Pulmonary effort is normal.     Breath sounds: Normal breath sounds.  Abdominal:     Palpations: Abdomen is soft.     Tenderness: There is  no abdominal tenderness. There is no right CVA tenderness or left CVA tenderness.  Skin:    Coloration: Skin is not pale.  Neurological:     General: No focal deficit present.     Mental Status: She is alert and oriented to person, place, and time.  Psychiatric:        Behavior: Behavior normal.      UC Treatments / Results  Labs (all labs ordered are listed, but only abnormal results are displayed) Labs Reviewed  POCT URINALYSIS DIP (MANUAL ENTRY) - Abnormal; Notable for the following components:      Result Value   Ketones, POC UA trace (5) (*)    Leukocytes, UA Small (1+) (*)    All other components within normal limits  URINE CULTURE  CERVICOVAGINAL ANCILLARY ONLY    EKG   Radiology No results found.  Procedures Procedures (including critical care time)  Medications Ordered in UC Medications - No data to display  Initial Impression / Assessment and Plan / UC Course  I have reviewed the triage vital signs and the nursing notes.  Pertinent labs & imaging results that were available during my care of the patient were reviewed by me and considered in my medical decision making (see chart for details).        Symptoms still seen most consistent with cystitis Incomplete bladder emptying.  Macrodantin is sent in to treat cystitis, urine culture is sent.  Vaginal self swab was done, and if anything is positive on that staff will notify her and treat protocol.  If she is not improving she needs to follow-up with her primary care Final Clinical Impressions(s) / UC Diagnoses   Final diagnoses:  Cystitis  Dysuria     Discharge Instructions      The urinalysis shows some white blood cells, which could be a sign of a bladder(hay globulos blancos en la orina, podria ser senal de infeccion en la vejiga)  Take nitrofurantoin 100 mg--1 capsule 2 times daily for 5 days; this is the antibiotic  (tome 1 capsula por  la boca 2 veces al dia por 5 dias; es el  antibiotico)  Take Pyridium/phenazopyridine 100 mg--1 tablet 3 times daily as needed for urinary pain.  This medication usually makes the urine orange  (1 tableta por la boca 3 veces al dia cuando tiene dolor al orinar; puede causar que la orina esta naranjada)   Staff will notify you if there is anything positive on the swab (le van hablar si hay algo positiva en el swab) If you are not improving, please follow-up with your primary care  (por favor haga cita con su doctor general)       ED Prescriptions     Medication Sig Dispense Auth. Provider   nitrofurantoin, macrocrystal-monohydrate, (MACROBID) 100 MG capsule Take 1 capsule (100 mg total) by mouth 2 (two) times daily for 5 days. 10 capsule Zenia Resides, MD   phenazopyridine (PYRIDIUM) 100 MG tablet Take 1 tablet (100 mg total) by mouth 3 (three) times daily as needed (urinary pain). 10 tablet Marlinda Mike Janace Aris, MD      PDMP not reviewed this encounter.   Zenia Resides, MD 05/18/23 [phone]

## 2023-05-18 NOTE — Discharge Instructions (Addendum)
The urinalysis shows some white blood cells, which could be a sign of a bladder(hay globulos blancos en la orina, podria ser senal de infeccion en la vejiga)  Take nitrofurantoin 100 mg--1 capsule 2 times daily for 5 days; this is the antibiotic  (tome 1 capsula por la boca 2 veces al dia por 5 dias; es el antibiotico)  Take Pyridium/phenazopyridine 100 mg--1 tablet 3 times daily as needed for urinary pain.  This medication usually makes the urine orange  (1 tableta por la boca 3 veces al dia cuando tiene dolor al orinar; puede causar que la orina esta naranjada)   Staff will notify you if there is anything positive on the swab (le van hablar si hay algo positiva en el swab) If you are not improving, please follow-up with your primary care  (por favor haga cita con su doctor general)

## 2023-05-18 NOTE — ED Triage Notes (Signed)
Pt reports for 2 weeks having dysuria, urinary frequency and urgency to urinate but when goes only small amount comes out. Pt seen at UC at [facility] week ago and was started on anabiotics but was called and told to stop them due to urine culture being normal. Also having lower back pains. Took Ibuprofen

## 2023-05-18 NOTE — Therapy (Signed)
OUTPATIENT PHYSICAL THERAPY LOWER EXTREMITY EVALUATION   Patient Name: Dana Wade MRN: [id number] DOB:1981/04/10, 42 y.o., female Today's Date: 05/18/2023  END OF SESSION:  PT End of Session - 05/18/23 1300     Visit Number 1    Number of Visits 13    Date for PT Re-Evaluation 06/29/23    PT Start Time 1301    PT Stop Time 1340    PT Time Calculation (min) 39 min    Behavior During Therapy WFL for tasks assessed/performed             Past Medical History:  Diagnosis Date   Dermatitis    Infection    kidney infection 4-5 years ago   Past Surgical History:  Procedure Laterality Date   CESAREAN SECTION N/A 02/21/2019   Procedure: CESAREAN SECTION;  Surgeon: [place] Bing, MD;  Location: [facility];  Service: Obstetrics;  Laterality: N/A;   DILATION AND EVACUATION N/A 12/31/2015   Procedure: DILATATION AND EVACUATION;  Surgeon: Tereso Newcomer, MD;  Location: [facility];  Service: Gynecology;  Laterality: N/A;   INCISION AND DRAINAGE POSTERIOR SACRAL SPINE  2006   fell while pregnant, had large hematoma   TUBAL LIGATION     Patient Active Problem List   Diagnosis Date Noted   H/O cesarean section 02/19/2023    PCP: none reported  REFERRING PROVIDER: Rodolph Bong, MD  REFERRING DIAG: Chronic pain of left knee [M25.562, G89.29]   THERAPY DIAG:  No diagnosis found.  Rationale for Evaluation and Treatment: Rehabilitation  ONSET DATE: approximately 1 month   SUBJECTIVE:   SUBJECTIVE STATEMENT: In-person interpreter was present throughout today's session   "One day I was going down the steps. On the last step I felt a pain in the front of my knee. Then, it was hurting all the time." She reports that the injection she received on 04/24/23 at sports medicine clinic was helpful and it doesn't hurt as much any more." She has ongoing difficulty with bending, and kneeling as needed for her normal housekeeping/occupational tasks.   PERTINENT HISTORY: No  relevant PMHx reported  PAIN:  Are you having pain? Yes: NPRS scale: 2-3/10 Pain location: Lt anterior knee  Pain description: "bone-on-bone hitting" with stair climbing, some popping, clicking present sence DOI Aggravating factors: bending, kneeling, stair climbing Relieving factors: injection  PRECAUTIONS: None  WEIGHT BEARING RESTRICTIONS: No  FALLS:  Has patient fallen in last 6 months? No  LIVING ENVIRONMENT: Lives with: lives with their family Lives in: House/apartment Stairs: Yes: External: 3 steps; on left going up Has following equipment at home: None  OCCUPATION: Housekeeping at [employer]   PLOF: Independent; "before this happened, I wouldn't even use the elevators much, just the stairs."   PATIENT GOALS: Patient would like to be able to go up and down the stairs, return to "general cleaning" at work that requires kneeling and bending.   NEXT MD VISIT: 07/03/23   OBJECTIVE:   DIAGNOSTIC FINDINGS:  04/24/23 DG Knee AP/LAT W/[place] Left  FINDINGS: No evidence of fracture, dislocation, or joint effusion. No evidence of arthropathy or other focal bone abnormality. Soft tissues are unremarkable.   IMPRESSION: Negative.    PATIENT SURVEYS:  FOTO 60 current, 75 predicted   COGNITION: Overall cognitive status: Within functional limits for tasks assessed     SENSATION: WFL   MUSCLE LENGTH: Hamstrings: Moderate limitation, L>R  POSTURE: No Significant postural limitations  PALPATION: Mild hypomobility with superior patellar glides No tenderness with  palpation   LOWER EXTREMITY ROM:  Active ROM Right eval Left eval  Hip flexion    Hip extension    Hip abduction    Hip adduction    Hip internal rotation    Hip external rotation    Knee flexion 130 130  Knee extension 0 0  Ankle dorsiflexion    Ankle plantarflexion    Ankle inversion    Ankle eversion     (Blank rows = not tested)  LOWER EXTREMITY MMT:  MMT Right eval Left eval  Hip  flexion 5 5  Hip extension    Hip abduction 5 5  Hip adduction 4+ 4+  Hip internal rotation    Hip external rotation    Knee flexion 5 5  Knee extension 5 5  Ankle dorsiflexion 5 5  Ankle plantarflexion    Ankle inversion    Ankle eversion     (Blank rows = not tested)  LOWER EXTREMITY SPECIAL TESTS:  - valgus stress test, - varus stress test    FUNCTIONAL TESTS:  Squatting: increased L knee valgus, excessive anterior weight shifting Lunging: increased L knee valgus with Lt lunging; excessive anterior weight shifting, use of UE support necessary  Stair Climbing: excessive L toe out and hip external rotation; anterior Lt knee pain reported with corrected mechanics    GAIT: Distance walked: 100 ft Assistive device utilized: None Level of assistance: Complete Independence Comments: slight L toe out and L hip ER noted throughout gait cycle    TODAY'S TREATMENT:                                                                                                                                [facility] Adult PT Treatment:                                                DATE: 05/18/2023  Therapeutic Activity: See patient education below.   PATIENT EDUCATION:  Education details:  discussion of exam findings and functional deficits, likely contributing factors to pain. POC, prognosis and goals for skilled PT   Person educated: Patient via interpreter Education method: Explanation and Demonstration Education comprehension: verbalized understanding, returned demonstration, and needs further education  HOME EXERCISE PROGRAM: To be initiated at f/u visit  ASSESSMENT:  CLINICAL IMPRESSION: Patient is a 42 y.o. female who was seen today for physical therapy evaluation and treatment for Left Knee Pain, likely related to overuse. She is demonstrating altered gait mechanics, Lt hamstring tightness, decreased hip adduction strength, and mild patellar hypomobility.  She has related pain and  difficulty with kneeling, squatting, lunging, stair climbing and performance of normal cleaning activities as necessary for home maintenance and occupational duties.  Patient requires skilled physical therapy services at this time to address relevant deficit and minimize pain and  return to prior level of function.  OBJECTIVE IMPAIRMENTS: Abnormal gait, decreased activity tolerance, decreased endurance, decreased strength, improper body mechanics, and pain.   ACTIVITY LIMITATIONS: bending, squatting, stairs, transfers, and locomotion level  PARTICIPATION LIMITATIONS: cleaning, community activity, and occupation  PERSONAL FACTORS: Profession are also affecting patient's functional outcome.   REHAB POTENTIAL: Good  CLINICAL DECISION MAKING: Stable/uncomplicated  EVALUATION COMPLEXITY: Low   GOALS: Goals reviewed with patient? Yes  SHORT TERM GOALS: Target date: 06/08/2023   Patient will be independent with initial home program for LE strengthening and improved body mechanics.  Baseline: to be initiated at follow up visit Goal status: INITIAL  2.  Patient will demonstrate proper stair climbing mechanics with <5/10 pain.  Baseline: moderate toe out/L ER Goal status: INITIAL   LONG TERM GOALS: Target date: 06/29/2023   Patient will report improved overall functional ability with FOTO score of 75 or greater.   Baseline: 60 current Goal status: INITIAL  2.  Patient report ability to ascend and descend stairs at work for at least 50% of her day without pain. Baseline: Unable Goal status: INITIAL  3.  Patient will demonstrate normal mobility with patellar glides in all directions. Baseline: See objective findings Goal status: INITIAL  4.  Patient will be able to perform floor to stand transfer without pain. Baseline: Unable Goal status: INITIAL  5.  Patient reported ability to return to normal cleaning activities with less pain 3/10 pain. Goal status:  INITIAL    PLAN:  PT FREQUENCY: 1-2x/week  PT DURATION: 6 weeks  PLANNED INTERVENTIONS: Therapeutic exercises, Therapeutic activity, Neuromuscular re-education, Balance training, Gait training, Patient/Family education, Self Care, Joint mobilization, Joint manipulation, Stair training, Dry Needling, Electrical stimulation, Cryotherapy, Moist heat, Taping, Ionotophoresis 4mg /ml Dexamethasone, Manual therapy, and Re-evaluation  PLAN FOR NEXT SESSION: initial home program, functional LE strengthening, pain modulation activities, address gait/squat/lunge/stair climbing mechanics, address floor to stand transfers   Mauri Reading, PT, DPT 05/18/2023, 2:51 PM

## 2023-05-19 LAB — URINE CULTURE

## 2023-05-21 LAB — CERVICOVAGINAL ANCILLARY ONLY
Bacterial Vaginitis (gardnerella): POSITIVE — AB
Candida Glabrata: NEGATIVE
Candida Vaginitis: NEGATIVE
Chlamydia: NEGATIVE
Comment: NEGATIVE
Comment: NEGATIVE
Comment: NEGATIVE
Comment: NEGATIVE
Comment: NEGATIVE
Comment: NORMAL
Neisseria Gonorrhea: NEGATIVE
Trichomonas: NEGATIVE

## 2023-05-22 ENCOUNTER — Telehealth: Payer: Self-pay | Admitting: Emergency Medicine

## 2023-05-22 MED ORDER — METRONIDAZOLE 500 MG PO TABS: 500.0000 mg | ORAL_TABLET | Freq: Two times a day (BID) | ORAL | 0 refills | Status: DC

## 2023-05-30 ENCOUNTER — Ambulatory Visit: Payer: Self-pay

## 2023-05-31 ENCOUNTER — Ambulatory Visit: Payer: Self-pay

## 2023-06-05 ENCOUNTER — Ambulatory Visit: Payer: Self-pay | Admitting: Physical Therapy

## 2023-06-07 ENCOUNTER — Ambulatory Visit: Payer: Self-pay

## 2023-06-12 ENCOUNTER — Ambulatory Visit: Payer: Self-pay | Admitting: Physical Therapy

## 2023-06-26 ENCOUNTER — Ambulatory Visit: Payer: Self-pay

## 2023-07-03 ENCOUNTER — Ambulatory Visit: Payer: No Typology Code available for payment source | Admitting: Family Medicine

## 2023-10-16 ENCOUNTER — Other Ambulatory Visit: Payer: Self-pay | Admitting: Obstetrics and Gynecology

## 2023-10-16 DIAGNOSIS — Z1231 Encounter for screening mammogram for malignant neoplasm of breast: Secondary | ICD-10-CM

## 2023-12-27 ENCOUNTER — Encounter (HOSPITAL_COMMUNITY): Payer: Self-pay

## 2023-12-27 ENCOUNTER — Ambulatory Visit (HOSPITAL_COMMUNITY)
Admission: EM | Admit: 2023-12-27 | Discharge: 2023-12-27 | Disposition: A | Discharge status: Home / Self Care | Payer: Self-pay | Attending: Internal Medicine | Admitting: Internal Medicine

## 2023-12-27 DIAGNOSIS — J40 Bronchitis, not specified as acute or chronic: Secondary | ICD-10-CM

## 2023-12-27 MED ORDER — ALBUTEROL SULFATE (2.5 MG/3ML) 0.083% IN NEBU
2.5000 mg | INHALATION_SOLUTION | Freq: Once | RESPIRATORY_TRACT | Status: AC
  Administered 2023-12-27: 2.5 mg via RESPIRATORY_TRACT

## 2023-12-27 MED ORDER — ALBUTEROL SULFATE (2.5 MG/3ML) 0.083% IN NEBU
INHALATION_SOLUTION | RESPIRATORY_TRACT | Status: AC
  Filled 2023-12-27: qty 3

## 2023-12-27 MED ORDER — DOXYCYCLINE HYCLATE 100 MG PO CAPS: 100.0000 mg | ORAL_CAPSULE | Freq: Two times a day (BID) | ORAL | 0 refills | Status: DC

## 2023-12-27 MED ORDER — PREDNISONE 20 MG PO TABS: 20.0000 mg | ORAL_TABLET | Freq: Every day | ORAL | 0 refills | Status: AC

## 2023-12-27 MED ORDER — ALBUTEROL SULFATE HFA 108 (90 BASE) MCG/ACT IN AERS: 2.0000 | INHALATION_SPRAY | RESPIRATORY_TRACT | 0 refills | Status: AC | PRN

## 2023-12-27 NOTE — ED Triage Notes (Signed)
Pt c/o cough, chest congestion, and sore throat x1 week. No meds taken today. Cough worse at night.

## 2023-12-27 NOTE — ED Provider Notes (Signed)
[facility]    CSN: [id number] Arrival date & time: 12/27/23  1629      History   Chief Complaint Chief Complaint  Patient presents with   Cough    HPI Dana Wade is a 43 y.o. female who presents with onset of cough, chest congestion and ST one week ago. Cough gets worse at night time and has been wheezing for the past 2 days. She started with body aches and subjective fever and up til yesterday she was still chilling. She has been sweating. Denies hx of asthma or being a smoker.     Past Medical History:  Diagnosis Date   Dermatitis    Infection    kidney infection 4-5 years ago    Patient Active Problem List   Diagnosis Date Noted   H/O cesarean section 02/19/2023    Past Surgical History:  Procedure Laterality Date   CESAREAN SECTION N/A 02/21/2019   Procedure: CESAREAN SECTION;  Surgeon: [place] Bing, MD;  Location: [facility];  Service: Obstetrics;  Laterality: N/A;   DILATION AND EVACUATION N/A 12/31/2015   Procedure: DILATATION AND EVACUATION;  Surgeon: Tereso Newcomer, MD;  Location: [facility];  Service: Gynecology;  Laterality: N/A;   INCISION AND DRAINAGE POSTERIOR SACRAL SPINE  2006   fell while pregnant, had large hematoma   TUBAL LIGATION      OB History     Gravida  4   Para  3   Term  3   Preterm  0   AB  1   Living  3      SAB  1   IAB  0   Ectopic  0   Multiple  0   Live Births  3            Home Medications    Prior to Admission medications   Medication Sig Start Date End Date Taking? Authorizing Provider  albuterol (VENTOLIN HFA) 108 (90 Base) MCG/ACT inhaler Inhale 2 puffs into the lungs every 4 (four) hours as needed for wheezing or shortness of breath. 12/27/23  Yes Rodriguez-Southworth, Nettie Elm, PA-C  doxycycline (VIBRAMYCIN) 100 MG capsule Take 1 capsule (100 mg total) by mouth 2 (two) times daily. 12/27/23  Yes Rodriguez-Southworth, Nettie Elm, PA-C  predniSONE (DELTASONE) 20 MG tablet Take 1  tablet (20 mg total) by mouth daily with breakfast. 12/27/23  Yes Rodriguez-Southworth, Nettie Elm, PA-C    Family History Family History  Problem Relation Age of Onset   Hypertension Father     Social History Social History   Tobacco Use   Smoking status: Never   Smokeless tobacco: Never  Vaping Use   Vaping status: Never Used  Substance Use Topics   Alcohol use: No   Drug use: Never     Allergies   Adhesive [tape]   Review of Systems Review of Systems  As noted in HPI Physical Exam Triage Vital Signs ED Triage Vitals [12/27/23 1839]  Encounter Vitals Group     BP 123/83     Systolic BP Percentile      Diastolic BP Percentile      Pulse Rate 85     Resp 18     Temp 98.6 F (37 C)     Temp Source Oral     SpO2 98 %     Weight      Height      Head Circumference      Peak Flow      Pain  Score 9     Pain Loc      Pain Education      Exclude from Growth Chart    No data found.  Updated Vital Signs BP 123/83 (BP Location: Left Arm)   Pulse 85   Temp 98.6 F (37 C) (Oral)   Resp 18   LMP 12/25/2023   SpO2 98%   Visual Acuity Right Eye Distance:   Left Eye Distance:   Bilateral Distance:    Right Eye Near:   Left Eye Near:    Bilateral Near:     Physical Exam Physical Exam Constitutional:      General: She is not in acute distress.    Appearance: she is not toxic-appearing.  HENT:     Head: Normocephalic.     Right Ear: Tympanic membrane, ear canal and external ear normal.     Left Ear: Ear canal and external ear normal.     Nose: Nose normal.     Mouth/Throat:     Mouth: Mucous membranes are moist.     Pharynx: Oropharynx is clear.  Eyes:     General: No scleral icterus.    Conjunctiva/sclera: Conjunctivae normal.  Cardiovascular:     Rate and Rhythm: Normal rate and regular rhythm.     Heart sounds: No murmur heard.   Pulmonary:     Effort: Pulmonary effort is normal. No respiratory distress.     Breath sounds: Wheezing present  which resolved after Albuterol neb treatment.     Comments: Has auditory wheezing Musculoskeletal:        General: Normal range of motion.     Cervical back: Neck supple.  Lymphadenopathy:     Cervical: No cervical adenopathy.  Skin:    General: Skin is warm and dry.     Findings: No rash.  Neurological:     Mental Status: She is alert and oriented to person, place, and time.     Gait: Gait normal.  Psychiatric:        Mood and Affect: Mood normal.        Behavior: Behavior normal.        Thought Content: Thought content normal.        Judgment: Judgment normal.    UC Treatments / Results  Labs (all labs ordered are listed, but only abnormal results are displayed) Labs Reviewed - No data to display  EKG   Radiology No results found.  Procedures Procedures (including critical care time)  Medications Ordered in UC Medications  albuterol (PROVENTIL) (2.5 MG/3ML) 0.083% nebulizer solution 2.5 mg (2.5 mg Nebulization Given 12/27/23 1922)    Initial Impression / Assessment and Plan / UC Course  I have reviewed the triage vital signs and the nursing notes.  She was given Albuterol neb which helped resolve the wheezing  Acute bronchitis  I placed her on Doxy, Prednisone and Albuterol inhaler as noted.     Final Clinical Impressions(s) / UC Diagnoses   Final diagnoses:  Bronchitis   Discharge Instructions   None    ED Prescriptions     Medication Sig Dispense Auth. Provider   doxycycline (VIBRAMYCIN) 100 MG capsule Take 1 capsule (100 mg total) by mouth 2 (two) times daily. 20 capsule Rodriguez-Southworth, Nettie Elm, PA-C   predniSONE (DELTASONE) 20 MG tablet Take 1 tablet (20 mg total) by mouth daily with breakfast. 5 tablet Rodriguez-Southworth, Nettie Elm, PA-C   albuterol (VENTOLIN HFA) 108 (90 Base) MCG/ACT inhaler Inhale 2 puffs into the lungs every  4 (four) hours as needed for wheezing or shortness of breath. 6.7 g Rodriguez-Southworth, Nettie Elm, PA-C      PDMP  not reviewed this encounter.   Garey Ham, [place] 12/27/23 1934

## 2024-01-01 ENCOUNTER — Ambulatory Visit: Payer: Self-pay | Admitting: *Deleted

## 2024-01-01 ENCOUNTER — Ambulatory Visit
Admission: RE | Admit: 2024-01-01 | Discharge: 2024-01-01 | Disposition: A | Discharge status: Home / Self Care | Payer: No Typology Code available for payment source | Source: Ambulatory Visit | Attending: Obstetrics and Gynecology | Admitting: Obstetrics and Gynecology

## 2024-01-01 VITALS — BP 102/73 | Wt 170.0 lb

## 2024-01-01 DIAGNOSIS — Z1239 Encounter for other screening for malignant neoplasm of breast: Secondary | ICD-10-CM

## 2024-01-01 DIAGNOSIS — Z1231 Encounter for screening mammogram for malignant neoplasm of breast: Secondary | ICD-10-CM

## 2024-01-01 NOTE — Progress Notes (Signed)
[name] is a 43 y.o. female who presents to [facility] clinic today with no complaints.    Pap Smear: Pap smear not completed today. Last Pap smear was 09/07/2022 at the free cervical cancer screening clinic and was normal with negative HPV. Per patient has no history of an abnormal Pap smear. Last Pap smear result is available in Epic.   Physical exam: Breasts Breasts symmetrical. No skin abnormalities bilateral breasts. No nipple retraction bilateral breasts. No nipple discharge bilateral breasts. No lymphadenopathy. No lumps palpated bilateral breasts. No complaints of pain or tenderness on exam.  MM DIAG BREAST TOMO UNI LEFT Result Date: 08/29/2022 CLINICAL DATA:  Recall from screening for a possible left breast mass. Screening study was the baseline exam. EXAM: DIGITAL DIAGNOSTIC UNILATERAL LEFT MAMMOGRAM WITH TOMOSYNTHESIS; ULTRASOUND LEFT BREAST LIMITED TECHNIQUE: Left digital diagnostic mammography and breast tomosynthesis was performed.; Targeted ultrasound examination of the left breast was performed. COMPARISON:  08/17/2022. ACR Breast Density Category c: The breast tissue is heterogeneously dense, which may obscure small masses. FINDINGS: The possible mass in the lateral left breast noted on the current screening study persists as an oval circumscribed, 8-9 mm mass. There are no other masses, no areas of architectural distortion and no suspicious calcifications. Targeted left breast ultrasound is performed, showing a simple cyst at 4 o'clock, 4 cm the nipple, measuring 8 x 7 x 7 mm, consistent in size, shape and location to the mammographic mass. No solid masses or suspicious lesions. IMPRESSION: 1. No evidence of breast malignancy. 2. Benign left breast cyst. RECOMMENDATION: Screening mammogram in one year.(Code:SM-B-01Y) I have discussed the findings and recommendations with the patient. If applicable, a reminder letter will be sent to the patient regarding the next appointment.  BI-RADS CATEGORY  2: Benign. Electronically Signed   By: Amie Portland M.D.   On: 08/29/2022 16:34  MS DIGITAL SCREENING TOMO BILATERAL Result Date: 08/18/2022 CLINICAL DATA:  Screening. EXAM: DIGITAL SCREENING BILATERAL MAMMOGRAM WITH TOMOSYNTHESIS AND CAD TECHNIQUE: Bilateral screening digital craniocaudal and mediolateral oblique mammograms were obtained. Bilateral screening digital breast tomosynthesis was performed. The images were evaluated with computer-aided detection. COMPARISON:  None. ACR Breast Density Category c: The breast tissue is heterogeneously dense, which may obscure small masses. FINDINGS: In the left breast, a possible mass warrants further evaluation. In the right breast, no findings suspicious for malignancy. IMPRESSION: Further evaluation is suggested for a possible mass in the left breast. RECOMMENDATION: Diagnostic mammogram and possibly ultrasound of the left breast. (Code:[id number]-44M) The patient will be contacted regarding the findings, and additional imaging will be scheduled. BI-RADS CATEGORY  0: Incomplete. Need additional imaging evaluation and/or prior mammograms for comparison. Electronically Signed   By: Elberta Fortis M.D.   On: 08/18/2022 16:44    Pelvic/Bimanual Pap is not indicated today per BCCCP guidelines.   Smoking History: Patient has never smoked.   Patient Navigation: Patient education provided. Access to services provided for patient through [facility] program. Spanish interpreter Natale Lay from [facility] provided.    Breast and Cervical Cancer Risk Assessment: Patient does not have family history of breast cancer, known genetic mutations, or radiation treatment to the chest before age 20. Patient does not have history of cervical dysplasia, immunocompromised, or DES exposure in-utero.  Risk Scores as of Encounter on 01/01/2024     Dana Wade           5-year 0.59%   Lifetime 8.87%   This patient is His[place]/Latina but has no documented birth country, so the  Dana Wade model used data from [place] patients to calculate their risk score. Document a birth country in the Demographics activity for a more accurate score.         Last calculated by Caprice Red, CMA on 01/01/2024 at  8:51 AM        A: BCCCP exam without pap smear No complaints.  P: Referred patient to the [facility] for a screening mammogram on mobile unit. Appointment scheduled Tuesday, January 01, 2024 at 1000.  Priscille Heidelberg, RN 01/01/2024 9:31 AM

## 2024-01-01 NOTE — Patient Instructions (Signed)
Explained breast self awareness with [name]. Patient did not need a Pap smear today due to last Pap smear and HPV typing was 09/07/2022. Let her know BCCCP will cover Pap smears and HPV typing every 5 years unless has a history of abnormal Pap smears. Referred patient to the [facility] for a screening mammogram on mobile unit. Appointment scheduled Tuesday, January 01, 2024 at 1000. Patient aware of appointment and will be there. Let patient know the Breast Center will follow up with her within the next couple weeks with results of mammogram by letter or phone. Dana Wade verbalized understanding.  Alexandar Weisenberger, Kathaleen Maser, RN 9:31 AM

## 2024-08-07 ENCOUNTER — Ambulatory Visit: Payer: Self-pay | Admitting: Sports Medicine

## 2024-08-07 VITALS — BP 132/74 | HR 77 | Ht 63.0 in | Wt 183.0 lb

## 2024-08-07 DIAGNOSIS — M79601 Pain in right arm: Secondary | ICD-10-CM

## 2024-08-07 DIAGNOSIS — M25511 Pain in right shoulder: Secondary | ICD-10-CM

## 2024-08-07 DIAGNOSIS — S46011A Strain of muscle(s) and tendon(s) of the rotator cuff of right shoulder, initial encounter: Secondary | ICD-10-CM

## 2024-08-07 DIAGNOSIS — G8929 Other chronic pain: Secondary | ICD-10-CM

## 2024-08-07 NOTE — Progress Notes (Signed)
 Ben Evian Derringer D.[facility]MENTEEN [facility] [address] [place] [address] Phone: [phone]   Assessment and Plan:     1. Right arm pain (Primary) 2. Chronic right shoulder pain 3. Strain of right rotator cuff capsule, initial encounter -Chronic with exacerbation, initial visit - Right anterior shoulder pain most consistent with rotator cuff strain caused by physical activity lifting a couch 3 months ago with intermittent flares of pain with physical activity since that time - Discussed injection versus prescription medication such as prednisone  versus meloxicam course.  Patient elected for subacromial CSI.  Tolerated well per note below - Start HEP for rotator cuff - Recommend out of work until 08/11/2024 and then no lifting >15 pounds with right upper extremity for 4 weeks or until reevaluated.  Work note provided  Procedure: Subacromial Injection Side: Right  Risks explained and consent was given verbally. The site was cleaned with alcohol prep. A steroid injection was performed from posterior approach using 2mL of 1% lidocaine  without epinephrine and 1mL of kenalog 40mg /ml. This was well tolerated.  Needle was removed, hemostasis achieved, and post injection instructions were explained.   Pt was advised to call or return to clinic if these symptoms worsen or fail to improve as anticipated.    15 additional minutes spent for educating Therapeutic Home Exercise Program.  This included exercises focusing on stretching, strengthening, with focus on eccentric aspects.   Long term goals include an improvement in range of motion, strength, endurance as well as avoiding reinjury. Patient's frequency would include in 1-2 times a day, 3-5 times a week for a duration of 6-12 weeks. Proper technique shown and discussed handout in great detail with ATC.  All questions were discussed and answered.    Pertinent previous records reviewed include none   Follow Up: 4 weeks  for reevaluation.  Could consider prednisone  course versus meloxicam course versus physical therapy versus x-ray imaging   Subjective:   I, Moenique Parris, am serving as a Neurosurgeon for Doctor Morene Mace  Chief Complaint: right arm pain   HPI:   08/07/24 Patient is a 43 year old female with right arm pain. Patient states anterior shoulder pain that radiates down her arm to her hand and fingers. Pain for about 3 months. She lifted a sofa and was told she had a tear by workers comp. No imaging was done. This Sunday her shoulder pain was flared. Ibu for the pain and it doesn't help. Decreased ROM. She isnt able to do ADLs with out pain. 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    Relevant Historical Information: None pertinent  Additional pertinent review of systems negative.   Current Outpatient Medications:    albuterol  (VENTOLIN  HFA) 108 (90 Base) MCG/ACT inhaler, Inhale 2 puffs into the lungs every 4 (four) hours as needed for wheezing or shortness of breath. (Patient not taking: Reported on 01/01/2024), Disp: 6.7 g, Rfl: 0   predniSONE  (DELTASONE ) 20 MG tablet, Take 1 tablet (20 mg total) by mouth daily with breakfast., Disp: 5 tablet, Rfl: 0   Objective:     Vitals:   08/07/24 0936  BP: 132/74  Pulse: 77  SpO2: 98%  Weight: 183 lb (83 kg)  Height: 5' 3 (1.6 m)      Body mass index is 32.42 kg/m.    Physical Exam:    Gen: Appears well, nad, nontoxic and pleasant Neuro:sensation intact, strength is 5/5 with df/pf/inv/ev, muscle tone wnl  Skin: no suspicious lesion or defmority Psych: A&O, appropriate mood and affect  Right shoulder:  No deformity, swelling or muscle wasting No scapular winging FF 180, abd 180, int 0, ext 90 with painful arc in all directions TTP deltoid, coracoid, biceps groove NTTP over the [place], clavicle, ac, , humerus,  , trapezius, cervical spine Positive neer, hawkins, empty can, obriens, crossarm, subscap liftoff, speeds Neg  ant drawer, sulcus sign, apprehension Negative Spurling's test bilat FROM of neck right   Electronically signed by:  Odis Mace D.[facility]MENT[facility] 9:57 AM 08/07/24

## 2024-08-07 NOTE — Patient Instructions (Addendum)
 Shoulder HEP   Work note provided she is to remain out of work until Monday 08/11/2024  4 week follow up

## 2024-09-03 NOTE — Progress Notes (Unsigned)
    Ben Jackson D.[facility] [address] [place] [address] Phone: [phone]   Assessment and Plan:     1. Right arm pain (Primary) 2. Chronic right shoulder pain 3. Strain of right rotator cuff capsule, initial encounter -Chronic with exacerbation, subsequent visit - Overall significant improvement in right anterior shoulder pain after subacromial CSI on 9/25-25.  Consistent with resolving rotator cuff strain likely from physical activity working - Continue HEP - Use meloxicam 15 mg daily as needed for breakthrough pain.  Recommend limiting chronic NSAIDs to 1-2 doses per week to prevent long-term side effects. Use Tylenol  500 to 1000 mg tablets 2-3 times a day as needed for day-to-day pain relief. - Cleared to return to work without restrictions.  Work note provided    Pertinent previous records reviewed include none   Follow Up: As needed if no improvement or worsening of symptoms.  Could consider repeat CSI versus NSAID course versus prednisone  course versus physical therapy versus x-ray imaging   Subjective:   I, Dana Wade, am serving as a Neurosurgeon for Doctor Morene Mace   Chief Complaint: right arm pain    HPI:    08/07/24 Patient is a 43 year old female with right arm pain. Patient states anterior shoulder pain that radiates down her arm to her hand and fingers. Pain for about 3 months. She lifted a sofa and was told she had a tear by workers comp. No imaging was done. This Sunday her shoulder pain was flared. Ibu for the pain and it doesn't help. Decreased ROM. She isnt able to do ADLs with out pain. 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    09/04/2024 Patient states she is doing better    Relevant Historical Information: None pertinent  Additional pertinent review of systems negative.   Current Outpatient Medications:    meloxicam (MOBIC) 15 MG tablet, Take 1 tablet (15 mg total) by mouth daily  as needed for pain., Disp: 30 tablet, Rfl: 0   albuterol  (VENTOLIN  HFA) 108 (90 Base) MCG/ACT inhaler, Inhale 2 puffs into the lungs every 4 (four) hours as needed for wheezing or shortness of breath. (Patient not taking: Reported on 01/01/2024), Disp: 6.7 g, Rfl: 0   predniSONE  (DELTASONE ) 20 MG tablet, Take 1 tablet (20 mg total) by mouth daily with breakfast., Disp: 5 tablet, Rfl: 0   Objective:     Vitals:   09/04/24 0942  BP: 126/84  Pulse: 64  SpO2: 99%  Weight: 180 lb (81.6 kg)  Height: 5' 3 (1.6 m)      Body mass index is 31.89 kg/m.    Physical Exam:    Gen: Appears well, nad, nontoxic and pleasant Neuro:sensation intact, strength is 5/5, muscle tone wnl Skin: no suspicious lesion or defmority Psych: A&O, appropriate mood and affect  Right shoulder:  No deformity, swelling or muscle wasting No scapular winging FF 180, abd 180, int 0, ext 90 NTTP over the , clavicle, ac, coracoid, biceps groove, humerus, deltoid, trapezius, cervical spine Neg neer, hawkins, empty can, obriens, crossarm, subscap liftoff, speeds Neg ant drawer, sulcus sign, apprehension Negative Spurling's test bilat FROM of neck    Electronically signed by:  Odis Mace D.[facility] 9:57 AM 09/04/24

## 2024-09-04 ENCOUNTER — Ambulatory Visit: Payer: Self-pay | Admitting: Sports Medicine

## 2024-09-04 VITALS — BP 126/84 | HR 64 | Ht 63.0 in | Wt 180.0 lb

## 2024-09-04 DIAGNOSIS — M79601 Pain in right arm: Secondary | ICD-10-CM

## 2024-09-04 DIAGNOSIS — G8929 Other chronic pain: Secondary | ICD-10-CM

## 2024-09-04 DIAGNOSIS — M25511 Pain in right shoulder: Secondary | ICD-10-CM

## 2024-09-04 DIAGNOSIS — S46011A Strain of muscle(s) and tendon(s) of the rotator cuff of right shoulder, initial encounter: Secondary | ICD-10-CM

## 2024-09-04 MED ORDER — MELOXICAM 15 MG PO TABS: 15.0000 mg | ORAL_TABLET | Freq: Every day | ORAL | 0 refills | Status: AC | PRN

## 2024-09-04 NOTE — Patient Instructions (Signed)
-   Use meloxicam  15 mg daily as needed for breakthrough pain.  Recommend limiting chronic NSAIDs to 1-2 doses per week to prevent long-term side effects. Use Tylenol 500 to 1000 mg tablets 2-3 times a day as needed for day-to-day pain relief.    Refill meloxicam    Continue HEP   As needed follow up
